# Patient Record
Sex: Male | Born: 1970 | Race: White | Hispanic: No | Marital: Married | Smoking: Current every day smoker
Health system: Southern US, Community
[De-identification: ages and names within clinical notes are randomized; demographics above are authoritative.]

## PROBLEM LIST (undated history)

## (undated) DIAGNOSIS — J811 Chronic pulmonary edema: Secondary | ICD-10-CM

## (undated) DIAGNOSIS — E119 Type 2 diabetes mellitus without complications: Secondary | ICD-10-CM

## (undated) DIAGNOSIS — I10 Essential (primary) hypertension: Secondary | ICD-10-CM

## (undated) DIAGNOSIS — F419 Anxiety disorder, unspecified: Secondary | ICD-10-CM

## (undated) DIAGNOSIS — E1159 Type 2 diabetes mellitus with other circulatory complications: Secondary | ICD-10-CM

## (undated) HISTORY — DX: Type 2 diabetes mellitus with other circulatory complications: E11.59

## (undated) HISTORY — PX: NO PAST SURGERIES: SHX2092

## (undated) HISTORY — DX: Essential (primary) hypertension: I10

---

## 2002-10-11 ENCOUNTER — Emergency Department (HOSPITAL_COMMUNITY): Admission: EM | Admit: 2002-10-11 | Discharge: 2002-10-11 | Payer: Self-pay | Admitting: *Deleted

## 2003-08-10 ENCOUNTER — Emergency Department (HOSPITAL_COMMUNITY): Admission: EM | Admit: 2003-08-10 | Discharge: 2003-08-10 | Payer: Self-pay | Admitting: Emergency Medicine

## 2004-03-08 ENCOUNTER — Emergency Department (HOSPITAL_COMMUNITY): Admission: EM | Admit: 2004-03-08 | Discharge: 2004-03-08 | Payer: Self-pay | Admitting: Emergency Medicine

## 2004-03-10 ENCOUNTER — Emergency Department (HOSPITAL_COMMUNITY): Admission: EM | Admit: 2004-03-10 | Discharge: 2004-03-10 | Payer: Self-pay | Admitting: Emergency Medicine

## 2005-03-27 ENCOUNTER — Ambulatory Visit: Payer: Self-pay | Admitting: Family Medicine

## 2005-04-07 ENCOUNTER — Ambulatory Visit: Payer: Self-pay | Admitting: Family Medicine

## 2005-05-11 ENCOUNTER — Ambulatory Visit: Payer: Self-pay | Admitting: Family Medicine

## 2005-05-13 ENCOUNTER — Ambulatory Visit (HOSPITAL_COMMUNITY): Admission: RE | Admit: 2005-05-13 | Discharge: 2005-05-13 | Payer: Self-pay | Admitting: Family Medicine

## 2005-05-13 ENCOUNTER — Encounter (HOSPITAL_COMMUNITY): Admission: RE | Admit: 2005-05-13 | Discharge: 2005-06-12 | Payer: Self-pay | Admitting: Family Medicine

## 2006-02-03 ENCOUNTER — Emergency Department (HOSPITAL_COMMUNITY): Admission: EM | Admit: 2006-02-03 | Discharge: 2006-02-03 | Payer: Self-pay | Admitting: Emergency Medicine

## 2007-06-16 ENCOUNTER — Encounter: Payer: Self-pay | Admitting: Family Medicine

## 2009-09-02 ENCOUNTER — Emergency Department (HOSPITAL_COMMUNITY): Admission: EM | Admit: 2009-09-02 | Discharge: 2009-09-02 | Payer: Self-pay | Admitting: Emergency Medicine

## 2009-12-24 ENCOUNTER — Emergency Department (HOSPITAL_COMMUNITY): Admission: EM | Admit: 2009-12-24 | Discharge: 2009-12-24 | Payer: Self-pay | Admitting: Emergency Medicine

## 2010-07-15 NOTE — Letter (Signed)
Summary: rpc chart  rpc chart   Imported By: Curtis Sites 03/17/2010 09:56:40  _____________________________________________________________________  External Attachment:    Type:   Image     Comment:   External Document

## 2014-08-01 DIAGNOSIS — E11649 Type 2 diabetes mellitus with hypoglycemia without coma: Secondary | ICD-10-CM | POA: Insufficient documentation

## 2017-04-03 ENCOUNTER — Emergency Department (HOSPITAL_BASED_OUTPATIENT_CLINIC_OR_DEPARTMENT_OTHER)
Admission: EM | Admit: 2017-04-03 | Discharge: 2017-04-03 | Disposition: A | Discharge status: Home / Self Care | Payer: No Typology Code available for payment source | Attending: Emergency Medicine | Admitting: Emergency Medicine

## 2017-04-03 ENCOUNTER — Encounter (HOSPITAL_BASED_OUTPATIENT_CLINIC_OR_DEPARTMENT_OTHER): Payer: Self-pay | Admitting: *Deleted

## 2017-04-03 ENCOUNTER — Emergency Department (HOSPITAL_BASED_OUTPATIENT_CLINIC_OR_DEPARTMENT_OTHER): Payer: No Typology Code available for payment source

## 2017-04-03 DIAGNOSIS — Z79899 Other long term (current) drug therapy: Secondary | ICD-10-CM | POA: Insufficient documentation

## 2017-04-03 DIAGNOSIS — R1012 Left upper quadrant pain: Secondary | ICD-10-CM

## 2017-04-03 DIAGNOSIS — E119 Type 2 diabetes mellitus without complications: Secondary | ICD-10-CM | POA: Insufficient documentation

## 2017-04-03 DIAGNOSIS — F17210 Nicotine dependence, cigarettes, uncomplicated: Secondary | ICD-10-CM | POA: Diagnosis not present

## 2017-04-03 HISTORY — DX: Chronic pulmonary edema: J81.1

## 2017-04-03 HISTORY — DX: Anxiety disorder, unspecified: F41.9

## 2017-04-03 HISTORY — DX: Type 2 diabetes mellitus without complications: E11.9

## 2017-04-03 LAB — CBC WITH DIFFERENTIAL/PLATELET
BASOS PCT: 0 %
Basophils Absolute: 0 10*3/uL (ref 0.0–0.1)
Eosinophils Absolute: 0.2 10*3/uL (ref 0.0–0.7)
Eosinophils Relative: 2 %
HEMATOCRIT: 46.3 % (ref 39.0–52.0)
HEMOGLOBIN: 16 g/dL (ref 13.0–17.0)
Lymphocytes Relative: 28 %
Lymphs Abs: 2.9 10*3/uL (ref 0.7–4.0)
MCH: 32.1 pg (ref 26.0–34.0)
MCHC: 34.6 g/dL (ref 30.0–36.0)
MCV: 92.8 fL (ref 78.0–100.0)
Monocytes Absolute: 0.7 10*3/uL (ref 0.1–1.0)
Monocytes Relative: 7 %
Neutro Abs: 6.6 10*3/uL (ref 1.7–7.7)
Neutrophils Relative %: 63 %
Platelets: 288 10*3/uL (ref 150–400)
RBC: 4.99 MIL/uL (ref 4.22–5.81)
RDW: 13 % (ref 11.5–15.5)
WBC: 10.3 10*3/uL (ref 4.0–10.5)

## 2017-04-03 LAB — URINALYSIS, ROUTINE W REFLEX MICROSCOPIC
BILIRUBIN URINE: NEGATIVE
GLUCOSE, UA: 100 mg/dL — AB
Hgb urine dipstick: NEGATIVE
KETONES UR: NEGATIVE mg/dL
LEUKOCYTES UA: NEGATIVE
Nitrite: NEGATIVE
Protein, ur: NEGATIVE mg/dL
Specific Gravity, Urine: 1.02 (ref 1.005–1.030)
pH: 6 (ref 5.0–8.0)

## 2017-04-03 LAB — COMPREHENSIVE METABOLIC PANEL
ALBUMIN: 4.2 g/dL (ref 3.5–5.0)
ALK PHOS: 79 U/L (ref 38–126)
ALT: 24 U/L (ref 17–63)
AST: 22 U/L (ref 15–41)
Anion gap: 5 (ref 5–15)
BUN: 27 mg/dL — AB (ref 6–20)
CALCIUM: 9.1 mg/dL (ref 8.9–10.3)
CO2: 23 mmol/L (ref 22–32)
Chloride: 107 mmol/L (ref 101–111)
Creatinine, Ser: 0.81 mg/dL (ref 0.61–1.24)
GFR calc Af Amer: >60 mL/min (ref >60)
GFR calc non Af Amer: >60 mL/min (ref >60)
Glucose, Bld: 126 mg/dL — ABNORMAL HIGH (ref 65–99)
Potassium: 4.3 mmol/L (ref 3.5–5.1)
Sodium: 135 mmol/L (ref 135–145)
Total Bilirubin: 0.3 mg/dL (ref 0.3–1.2)
Total Protein: 7.1 g/dL (ref 6.5–8.1)

## 2017-04-03 LAB — LIPASE, BLOOD: Lipase: 30 U/L (ref 11–51)

## 2017-04-03 LAB — MONONUCLEOSIS SCREEN: Mono Screen: NEGATIVE

## 2017-04-03 MED ORDER — SODIUM CHLORIDE 0.9 % IV BOLUS (SEPSIS)
1000.0000 mL | Freq: Once | INTRAVENOUS | Status: AC
  Administered 2017-04-03: 1000 mL via INTRAVENOUS

## 2017-04-03 MED ORDER — IOPAMIDOL (ISOVUE-300) INJECTION 61%
100.0000 mL | Freq: Once | INTRAVENOUS | Status: AC | PRN
  Administered 2017-04-03: 100 mL via INTRAVENOUS

## 2017-04-03 NOTE — ED Triage Notes (Signed)
Pt reports LUQ pain with tenderness for several months. Denies fever, n/v/d, sob, chest pain.

## 2017-04-03 NOTE — ED Notes (Signed)
CT will need to wait for bun/creat to result prior to imaging with IV contrast d/t pt hx DM, per radiology protocol

## 2017-04-03 NOTE — ED Notes (Signed)
ED Provider at bedside. 

## 2017-04-03 NOTE — ED Provider Notes (Signed)
[facility] EMERGENCY DEPARTMENT Provider Note   CSN: [id number] Arrival date & time: 04/03/17  0827     History   Chief Complaint Chief Complaint  Patient presents with  . Abdominal Pain    HPI Jose Barton is a 46 y.o. male.  Pt presents to the ED today with LUQ abdominal pain.  Sx have been going on for months, but it has gotten worse in the last week.  He has never seen a doctor for this pain.  He denies any other sx.      Past Medical History:  Diagnosis Date  . Anxiety   . Diabetes mellitus without complication (HCC)   . Pulmonary edema     There are no active problems to display for this patient.   History reviewed. No pertinent surgical history.     Home Medications    Prior to Admission medications   Medication Sig Start Date End Date Taking? Authorizing Provider  busPIRone (BUSPAR) 5 MG tablet Take 10 mg by mouth daily.   Yes [provider]  escitalopram (LEXAPRO) 5 MG tablet Take 5 mg by mouth daily.   Yes [provider]    Family History No family history on file.  Social History Social History  Substance Use Topics  . Smoking status: Current Every Day Smoker    Types: Cigarettes  . Smokeless tobacco: Never Used  . Alcohol use Yes     Comment: daily     Allergies   Codeine; Percocet [oxycodone-acetaminophen]; and Vicodin [hydrocodone-acetaminophen]   Review of Systems Review of Systems  Gastrointestinal: Positive for abdominal pain.  All other systems reviewed and are negative.    Physical Exam Updated Vital Signs BP 133/82   Pulse 73   Temp 98.5 F (36.9 C) (Oral)   Resp 20   Ht 5\' 8"  (1.727 m)   Wt 83.9 kg (185 lb)   SpO2 96%   BMI 28.13 kg/m   Physical Exam  Constitutional: He is oriented to person, place, and time. He appears well-developed and well-nourished.  HENT:  Head: Normocephalic and atraumatic.  Right Ear: External ear normal.  Left Ear: External ear normal.  Nose:  Nose normal.  Mouth/Throat: Oropharynx is clear and moist.  Eyes: Pupils are equal, round, and reactive to light. Conjunctivae and EOM are normal.  Neck: Normal range of motion. Neck supple.  Cardiovascular: Normal rate, regular rhythm, normal heart sounds and intact distal pulses.   Pulmonary/Chest: Effort normal and breath sounds normal.  Abdominal: Soft. Bowel sounds are normal. There is tenderness in the left upper quadrant.  Musculoskeletal: Normal range of motion.  Neurological: He is alert and oriented to person, place, and time.  Skin: Skin is warm. Capillary refill takes less than 2 seconds.  Psychiatric: He has a normal mood and affect. His behavior is normal. Judgment and thought content normal.  Nursing note and vitals reviewed.    ED Treatments / Results  Labs (all labs ordered are listed, but only abnormal results are displayed) Labs Reviewed  COMPREHENSIVE METABOLIC PANEL - Abnormal; Notable for the following:       Result Value   Glucose, Bld 126 (*)    BUN 27 (*)    All other components within normal limits  URINALYSIS, ROUTINE W REFLEX MICROSCOPIC - Abnormal; Notable for the following:    Glucose, UA 100 (*)    All other components within normal limits  CBC WITH DIFFERENTIAL/PLATELET  LIPASE, BLOOD  MONONUCLEOSIS SCREEN  EKG  EKG Interpretation None       Radiology Ct Abdomen Pelvis W Contrast  Result Date: 04/03/2017 CLINICAL DATA:  Left-sided abdominal pain for several months EXAM: CT ABDOMEN AND PELVIS WITH CONTRAST TECHNIQUE: Multidetector CT imaging of the abdomen and pelvis was performed using the standard protocol following bolus administration of intravenous contrast. CONTRAST:  ISOVUE-300 IOPAMIDOL (ISOVUE-300) INJECTION 61% COMPARISON:  None. FINDINGS: Lower chest: No acute abnormality. Hepatobiliary: Fatty infiltration of the liver is noted. The gallbladder is within normal limits. Pancreas: Unremarkable. No pancreatic ductal dilatation  or surrounding inflammatory changes. Spleen: Normal in size without focal abnormality. Adrenals/Urinary Tract: The adrenal glands are within normal limits. The kidneys are well visualized bilaterally within normal enhancement pattern. Normal excretion is noted. The bladder is decompressed. Stomach/Bowel: Stomach is within normal limits. Appendix appears normal. No evidence of bowel wall thickening, distention, or inflammatory changes. Vascular/Lymphatic: Aortic atherosclerosis. No enlarged abdominal or pelvic lymph nodes. Reproductive: Prostate is unremarkable. Other: No abdominal wall hernia or abnormality. No abdominopelvic ascites. Musculoskeletal: No acute or significant osseous findings. IMPRESSION: Chronic changes without acute abnormality. Electronically Signed   By: Alcide Clever M.D.   On: 04/03/2017 10:05    Procedures Procedures (including critical care time)  Medications Ordered in ED Medications  sodium chloride 0.9 % bolus 1,000 mL (0 mLs Intravenous Stopped 04/03/17 0942)  iopamidol (ISOVUE-300) 61 % injection 100 mL (100 mLs Intravenous Contrast Given 04/03/17 0942)     Initial Impression / Assessment and Plan / ED Course  I have reviewed the triage vital signs and the nursing notes.  Pertinent labs & imaging results that were available during my care of the patient were reviewed by me and considered in my medical decision making (see chart for details).     Pt looks good.  He did not want any pain meds here or for home.  He is given the number of GI to f/u.  He knows to return if worse.  Final Clinical Impressions(s) / ED Diagnoses   Final diagnoses:  Left upper quadrant pain    New Prescriptions New Prescriptions   No medications on file     Jacalyn Lefevre, MD 04/03/17 1017

## 2017-05-24 ENCOUNTER — Other Ambulatory Visit: Payer: Self-pay

## 2017-05-24 ENCOUNTER — Encounter (HOSPITAL_BASED_OUTPATIENT_CLINIC_OR_DEPARTMENT_OTHER): Payer: Self-pay | Admitting: Emergency Medicine

## 2017-05-24 ENCOUNTER — Emergency Department (HOSPITAL_BASED_OUTPATIENT_CLINIC_OR_DEPARTMENT_OTHER)
Admission: EM | Admit: 2017-05-24 | Discharge: 2017-05-24 | Disposition: A | Discharge status: Home / Self Care | Payer: No Typology Code available for payment source | Attending: Emergency Medicine | Admitting: Emergency Medicine

## 2017-05-24 DIAGNOSIS — S39012A Strain of muscle, fascia and tendon of lower back, initial encounter: Secondary | ICD-10-CM

## 2017-05-24 DIAGNOSIS — Y990 Civilian activity done for income or pay: Secondary | ICD-10-CM | POA: Insufficient documentation

## 2017-05-24 DIAGNOSIS — X501XXA Overexertion from prolonged static or awkward postures, initial encounter: Secondary | ICD-10-CM | POA: Insufficient documentation

## 2017-05-24 DIAGNOSIS — Y929 Unspecified place or not applicable: Secondary | ICD-10-CM | POA: Insufficient documentation

## 2017-05-24 DIAGNOSIS — S3992XA Unspecified injury of lower back, initial encounter: Secondary | ICD-10-CM | POA: Diagnosis present

## 2017-05-24 DIAGNOSIS — Y939 Activity, unspecified: Secondary | ICD-10-CM | POA: Diagnosis not present

## 2017-05-24 DIAGNOSIS — E119 Type 2 diabetes mellitus without complications: Secondary | ICD-10-CM | POA: Insufficient documentation

## 2017-05-24 DIAGNOSIS — F17210 Nicotine dependence, cigarettes, uncomplicated: Secondary | ICD-10-CM | POA: Insufficient documentation

## 2017-05-24 MED ORDER — CYCLOBENZAPRINE HCL 10 MG PO TABS
10.0000 mg | ORAL_TABLET | Freq: Once | ORAL | Status: AC
  Administered 2017-05-24: 10 mg via ORAL
  Filled 2017-05-24: qty 1

## 2017-05-24 MED ORDER — CYCLOBENZAPRINE HCL 10 MG PO TABS: 10.0000 mg | ORAL_TABLET | Freq: Three times a day (TID) | ORAL | 0 refills | Status: DC | PRN

## 2017-05-24 MED ORDER — IBUPROFEN 800 MG PO TABS: 800.0000 mg | ORAL_TABLET | Freq: Three times a day (TID) | ORAL | 0 refills | Status: DC | PRN

## 2017-05-24 MED ORDER — IBUPROFEN 800 MG PO TABS
800.0000 mg | ORAL_TABLET | Freq: Once | ORAL | Status: AC
  Administered 2017-05-24: 800 mg via ORAL
  Filled 2017-05-24: qty 1

## 2017-05-24 MED FILL — CYCLOBENZAPRINE 10 MG TABLE: 10 | 5 days supply | Qty: 15 | Fill #0

## 2017-05-24 MED FILL — IBUPROFEN 800 MG TAB: 800 | 10 days supply | Qty: 30 | Fill #0

## 2017-05-24 NOTE — ED Provider Notes (Signed)
TIME SEEN: 1:01 PM  CHIEF COMPLAINT: Back pain  HPI: Patient is a 46 year old male with history of type 2 diabetes who presents to the emergency department with diffuse lower back pain that started yesterday.  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  Denies any other injury.  States it is worse with walking and trying to straighten out fully.  Reports he has been alternating heat and ice and had his wife massage his back which has been helping him.  No fevers.  No history of back surgery or epidural injections.  No current numbness, tingling or focal weakness.  No bowel or bladder incontinence.  No urinary retention.  ROS: See HPI Constitutional: no fever  Eyes: no drainage  ENT: no runny nose   Cardiovascular:  no chest pain  Resp: no SOB  GI: no vomiting GU: no dysuria Integumentary: no rash  Allergy: no hives  Musculoskeletal: no leg swelling  Neurological: no slurred speech ROS otherwise negative  PAST MEDICAL HISTORY/PAST SURGICAL HISTORY:  Past Medical History:  Diagnosis Date  . Anxiety   . Diabetes mellitus without complication (HCC)   . Pulmonary edema     MEDICATIONS:  Prior to Admission medications   Medication Sig Start Date End Date Taking? Authorizing Provider  busPIRone (BUSPAR) 5 MG tablet Take 10 mg by mouth daily.    [provider]  escitalopram (LEXAPRO) 5 MG tablet Take 5 mg by mouth daily.    [provider]    ALLERGIES:  Allergies  Allergen Reactions  . Codeine Hives  . Percocet [Oxycodone-Acetaminophen] Hives  . Vicodin [Hydrocodone-Acetaminophen] Hives    SOCIAL HISTORY:  Social History   Tobacco Use  . Smoking status: Current Every Day Smoker    Types: Cigarettes  . Smokeless tobacco: Never Used  Substance Use Topics  . Alcohol use: Yes    Comment: daily    FAMILY HISTORY: History reviewed. No pertinent family history.  EXAM: BP (!) 138/93  (BP Location: Left Arm)   Pulse 87   Temp 98.3 F (36.8 C) (Oral)   Resp 18   Ht 5\' 8"  (1.727 m)   Wt 83.9 kg (185 lb)   SpO2 100%   BMI 28.13 kg/m  CONSTITUTIONAL: Alert and oriented and responds appropriately to questions. Well-appearing; well-nourished HEAD: Normocephalic EYES: Conjunctivae clear, pupils appear equal, EOMI ENT: normal nose; moist mucous membranes NECK: Supple, no meningismus, no nuchal rigidity, no LAD  CARD: RRR; S1 and S2 appreciated; no murmurs, no clicks, no rubs, no gallops RESP: Normal chest excursion without splinting or tachypnea; breath sounds clear and equal bilaterally; no wheezes, no rhonchi, no rales, no hypoxia or respiratory distress, speaking full sentences ABD/GI: Normal bowel sounds; non-distended; soft, non-tender, no rebound, no guarding, no peritoneal signs, no hepatosplenomegaly BACK:  The back appears normal and is tender to palpation over the right and left lumbar paraspinal muscles.  There is no midline spinal tenderness or step-off or deformity.  There is no redness, warmth, ecchymosis or swelling noted.  There is no CVA tenderness EXT: Normal ROM in all joints; non-tender to palpation; no edema; normal capillary refill; no cyanosis, no calf tenderness or swelling    SKIN: Normal color for age and race; warm; no rash NEURO: Moves all extremities equally, strength 5/5 in all 4 extremities, sensation to light touch intact diffusely, no saddle anesthesia, walks with a slow gait, 2+ deep tendon  reflexes in bilateral lower extremities PSYCH: The patient's mood and manner are appropriate. Grooming and personal hygiene are appropriate.  MEDICAL DECISION MAKING: Pt here with lumbar strain.  He has no red flag symptoms.  Doubt cauda equina, spinal stenosis, epidural abscess or hematoma, discitis or osteomyelitis, transverse myelitis.  Doubt fracture.  I do not feel he needs emergent imaging and he agrees.  States he has strong reaction to narcotics.  Will  discharge with Flexeril and ibuprofen.  Recommend alternating heat and ice.  Recommended stretching and continued movement.  Provided him with a work note.  Discussed return precautions.  Patient comfortable with this plan.  His wife is here and will drive him home.  At this time, I do not feel there is any life-threatening condition present. I have reviewed and discussed all results (EKG, imaging, lab, urine as appropriate) and exam findings with patient/family. I have reviewed nursing notes and appropriate previous records.  I feel the patient is safe to be discharged home without further emergent workup and can continue workup as an outpatient as needed. Discussed usual and customary return precautions. Patient/family verbalize understanding and are comfortable with this plan.  Outpatient follow-up has been provided if needed. All questions have been answered.      Kiyra Slaubaugh, Layla MawKristen N, DO 05/24/17 1311

## 2017-05-24 NOTE — ED Triage Notes (Signed)
Patient states that he started to have pain to his lower back starting on Friday - the patient reports that he turned the wrong way at work

## 2017-07-01 ENCOUNTER — Ambulatory Visit: Payer: Self-pay | Admitting: Physician Assistant

## 2017-07-13 ENCOUNTER — Ambulatory Visit: Payer: No Typology Code available for payment source | Admitting: Physician Assistant

## 2017-07-13 ENCOUNTER — Encounter: Payer: Self-pay | Admitting: Physician Assistant

## 2017-07-13 VITALS — BP 150/90 | HR 84 | Temp 98.4°F | Resp 16 | Ht 68.0 in | Wt 189.0 lb

## 2017-07-13 DIAGNOSIS — Z1329 Encounter for screening for other suspected endocrine disorder: Secondary | ICD-10-CM | POA: Diagnosis not present

## 2017-07-13 DIAGNOSIS — F17200 Nicotine dependence, unspecified, uncomplicated: Secondary | ICD-10-CM

## 2017-07-13 DIAGNOSIS — E119 Type 2 diabetes mellitus without complications: Secondary | ICD-10-CM

## 2017-07-13 DIAGNOSIS — E785 Hyperlipidemia, unspecified: Secondary | ICD-10-CM | POA: Diagnosis not present

## 2017-07-13 DIAGNOSIS — F419 Anxiety disorder, unspecified: Secondary | ICD-10-CM | POA: Diagnosis not present

## 2017-07-13 DIAGNOSIS — Z789 Other specified health status: Secondary | ICD-10-CM | POA: Diagnosis not present

## 2017-07-13 DIAGNOSIS — E1169 Type 2 diabetes mellitus with other specified complication: Secondary | ICD-10-CM

## 2017-07-13 DIAGNOSIS — Z130 Encounter for screening for diseases of the blood and blood-forming organs and certain disorders involving the immune mechanism: Secondary | ICD-10-CM

## 2017-07-13 DIAGNOSIS — F1010 Alcohol abuse, uncomplicated: Secondary | ICD-10-CM | POA: Insufficient documentation

## 2017-07-13 DIAGNOSIS — F1090 Alcohol use, unspecified, uncomplicated: Secondary | ICD-10-CM

## 2017-07-13 DIAGNOSIS — Z7289 Other problems related to lifestyle: Secondary | ICD-10-CM

## 2017-07-13 DIAGNOSIS — Z720 Tobacco use: Secondary | ICD-10-CM

## 2017-07-13 LAB — POCT GLYCOSYLATED HEMOGLOBIN (HGB A1C): Hemoglobin A1C: 6.1

## 2017-07-13 NOTE — Patient Instructions (Signed)

## 2017-07-13 NOTE — Progress Notes (Signed)
Patient: Jose Barton Male    DOB: [birth year]   47 y.o.   MRN: [id number] Visit Date: 07/13/2017  Today's Provider: Trey Sailors, PA-C   Chief Complaint  Patient presents with  . Establish Care  . Diabetes  . Depression   Subjective:    Jose Barton moved out here form [place], [place] after marrying his wife two years ago. He was previously seen by a provider in [place]. He has childrentwo grown and two twin 47 year old boys 33.   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 Tetanus shot: within the last ten years  Flu shot: declined Pneumonia vaccine: unknown, will get records  Currently smokes 1/2 pack per day and drinks 3-4 beers nightly. Does not use drugs.   He reports he has had diabetes since age 48. He reports he is not currently taking any medications for it. Previously was on metformin. He has not had an eye exam in 5 years. He denies any previous diagnosis of peripheral neuropathy. He reports he has never been on blood pressure medication. He was previously on Simvastatin but self discontinued.  He also has anxiety and depression, takes Lexapro 20 mg and Buspar 5 mg BID.   Diabetes  He presents for his follow-up diabetic visit. He has type 2 diabetes mellitus. His disease course has been stable. There are no hypoglycemic associated symptoms. Pertinent negatives for hypoglycemia include no headaches. Pertinent negatives for diabetes include no blurred vision, no chest pain, no fatigue, no foot paresthesias, no foot ulcerations, no polydipsia, no polyphagia, no polyuria, no visual change, no weakness and no weight loss. Symptoms are stable. His weight is stable. He is following a diabetic diet. He has not had a previous visit with a dietitian. Home blood sugar record trend: Pt does not check his blood sugar at home.  An ACE inhibitor/angiotensin II receptor blocker is not being taken. He does not see a  podiatrist.Eye exam is not current.  Depression         This is a chronic problem.The problem is unchanged (Pt reports doing well on current medication, just having a hard time sleeping).  Associated symptoms include insomnia.  Associated symptoms include no decreased concentration, no fatigue, no helplessness, no hopelessness, not irritable, no restlessness, no decreased interest, no appetite change, no body aches, no myalgias, no headaches, no indigestion, not sad and no suicidal ideas.  Past treatments include SSRIs - Selective serotonin reuptake inhibitors.  Compliance with treatment is good.  Previous treatment provided moderate relief.      Allergies  Allergen Reactions  . Codeine Hives  . Percocet [Oxycodone-Acetaminophen] Hives  . Vicodin [Hydrocodone-Acetaminophen] Hives     Current Outpatient Medications:  .  busPIRone (BUSPAR) 5 MG tablet, Take 5 mg by mouth 2 (two) times daily., Disp: , Rfl:  .  escitalopram (LEXAPRO) 20 MG tablet, Take 20 mg by mouth daily., Disp: , Rfl:  .  ibuprofen (ADVIL,MOTRIN) 800 MG tablet, Take 1 tablet (800 mg total) by mouth every 8 (eight) hours as needed for mild pain., Disp: 30 tablet, Rfl: 0  Review of Systems  Constitutional: Negative for appetite change, fatigue and weight loss.  Eyes: Negative for blurred vision.  Cardiovascular: Negative for chest pain.  Endocrine: Negative for polydipsia, polyphagia and polyuria.  Musculoskeletal: Negative for myalgias.  Neurological: Negative for weakness and headaches.  Psychiatric/Behavioral: Positive for depression. Negative for decreased concentration  and suicidal ideas. The patient has insomnia.     Social History   Tobacco Use  . Smoking status: Current Every Day Smoker    Packs/day: 0.50    Years: 30.00    Pack years: 15.00    Types: Cigarettes  . Smokeless tobacco: Never Used  Substance Use Topics  . Alcohol use: Yes    Alcohol/week: 14.4 oz    Types: 24 Cans of beer per week     Comment: daily   Objective:   BP (!) 144/94 (BP Location: Right Arm, Patient Position: Sitting, Cuff Size: Normal)   Pulse 84   Temp 98.4 F (36.9 C) (Oral)   Resp 16   Ht 5\' 8"  (1.727 m)   Wt 189 lb (85.7 kg)   BMI 28.74 kg/m  Vitals:   07/13/17 0919  BP: (!) 144/94  Pulse: 84  Resp: 16  Temp: 98.4 F (36.9 C)  TempSrc: Oral  Weight: 189 lb (85.7 kg)  Height: 5\' 8"  (1.727 m)   Depression screen [facility] 2/9 07/13/2017  Decreased Interest 0  Down, Depressed, Hopeless 0  PHQ - 2 Score 0  Altered sleeping 2  Tired, decreased energy 0  Change in appetite 0  Feeling bad or failure about yourself  0  Trouble concentrating 0  Moving slowly or fidgety/restless 0  Suicidal thoughts 0  PHQ-9 Score 2  Difficult doing work/chores Not difficult at all      Physical Exam  Constitutional: He appears well-developed and well-nourished. He is not irritable.  Cardiovascular: Normal rate and regular rhythm.  Pulmonary/Chest: Effort normal and breath sounds normal.  Abdominal: Soft. Bowel sounds are normal.  Skin: Skin is warm and dry.  Psychiatric: He has a normal mood and affect. His behavior is normal.   Diabetic Foot Exam - Simple   Simple Foot Form Diabetic Foot exam was performed with the following findings:  Yes 07/13/2017 12:00 PM  Visual Inspection No deformities, no ulcerations, no other skin breakdown bilaterally:  Yes Sensation Testing Intact to touch and monofilament testing bilaterally:  Yes Pulse Check Posterior Tibialis and Dorsalis pulse intact bilaterally:  Yes Comments         Assessment & Plan:     1. Controlled type 2 diabetes mellitus without complication, without long-term current use of insulin (HCC)  A1C today is 6.1%, diet controlled. Will check urine microalbumin as below. His BP is high today, need second reading to classify HTN. Will need ACEi if persistent microalbuminuria or HTN. Also will need statin if LDL >100 or CVD risk >7.5%, which with his  smoking, diabetes, and BP I imagine it will be. He has simvastatin at home that he is willing to restart if required. Will need to get records, if no record, then will administer pneumonia vaccine at next visit.  - Urine Microalbumin w/creat. ratio - Comprehensive Metabolic Panel (CMET) - Lipid Profile - Ambulatory referral to Ophthalmology - POCT HgB A1C  2. Hyperlipidemia associated with type 2 diabetes mellitus (HCC)  - Lipid Profile  3. Screening for thyroid disorder  - TSH  4. Screening for deficiency anemia  - CBC with Differential  5. Anxiety  Will refill buspar and lexapro.  6. Alcohol use  2 drinks is max daily allowance in adult male, his usage is over the limit and he has been made aware of this.  7. Tobacco abuse  Says he is allergic to Chantix and Wellbutrin. Does not want nicotine patch. Counseled on smoking cessation very important  to reduce HTN and risk of heart disease. 3 minutes smoking cessation counseling provided.  Return in about 4 weeks (around 08/10/2017) for dm, bp.  The entirety of the information documented in the History of Present Illness, Review of Systems and Physical Exam were personally obtained by me. Portions of this information were initially documented by April M. Hyacinth Meeker, CMA and reviewed by me for thoroughness and accuracy.          Trey Sailors, PA-C  [facility]

## 2017-07-14 LAB — CBC WITH DIFFERENTIAL/PLATELET
Basophils Absolute: 0 10*3/uL (ref 0.0–0.2)
Basos: 0 %
EOS (ABSOLUTE): 0.2 10*3/uL (ref 0.0–0.4)
Eos: 2 %
Hematocrit: 47.4 % (ref 37.5–51.0)
Hemoglobin: 16.5 g/dL (ref 13.0–17.7)
Immature Grans (Abs): 0.1 10*3/uL (ref 0.0–0.1)
Immature Granulocytes: 1 %
Lymphocytes Absolute: 3.2 10*3/uL — ABNORMAL HIGH (ref 0.7–3.1)
Lymphs: 24 %
MCH: 32.5 pg (ref 26.6–33.0)
MCHC: 34.8 g/dL (ref 31.5–35.7)
MCV: 93 fL (ref 79–97)
Monocytes Absolute: 0.9 10*3/uL (ref 0.1–0.9)
Monocytes: 7 %
Neutrophils Absolute: 9 10*3/uL — ABNORMAL HIGH (ref 1.4–7.0)
Neutrophils: 66 %
Platelets: 310 10*3/uL (ref 150–379)
RBC: 5.08 x10E6/uL (ref 4.14–5.80)
RDW: 13.4 % (ref 12.3–15.4)
WBC: 13.5 10*3/uL — ABNORMAL HIGH (ref 3.4–10.8)

## 2017-07-14 LAB — COMPREHENSIVE METABOLIC PANEL
ALT: 23 IU/L (ref 0–44)
AST: 19 IU/L (ref 0–40)
Albumin/Globulin Ratio: 2.2 (ref 1.2–2.2)
Albumin: 4.8 g/dL (ref 3.5–5.5)
Alkaline Phosphatase: 93 IU/L (ref 39–117)
BUN/Creatinine Ratio: 17 (ref 9–20)
BUN: 14 mg/dL (ref 6–24)
Bilirubin Total: 0.5 mg/dL (ref 0.0–1.2)
CO2: 21 mmol/L (ref 20–29)
Calcium: 9.4 mg/dL (ref 8.7–10.2)
Chloride: 102 mmol/L (ref 96–106)
Creatinine, Ser: 0.82 mg/dL (ref 0.76–1.27)
GFR calc Af Amer: 123 mL/min/{1.73_m2} (ref >59)
GFR calc non Af Amer: 106 mL/min/{1.73_m2} (ref >59)
Globulin, Total: 2.2 g/dL (ref 1.5–4.5)
Glucose: 92 mg/dL (ref 65–99)
Potassium: 4.3 mmol/L (ref 3.5–5.2)
Sodium: 142 mmol/L (ref 134–144)
Total Protein: 7 g/dL (ref 6.0–8.5)

## 2017-07-14 LAB — MICROALBUMIN / CREATININE URINE RATIO
Creatinine, Urine: 132.8 mg/dL
Microalb/Creat Ratio: 21.9 mg/g creat (ref 0.0–30.0)
Microalbumin, Urine: 29.1 ug/mL

## 2017-07-14 LAB — LIPID PANEL
Chol/HDL Ratio: 5.4 ratio — ABNORMAL HIGH (ref 0.0–5.0)
Cholesterol, Total: 233 mg/dL — ABNORMAL HIGH (ref 100–199)
HDL: 43 mg/dL (ref >39)
LDL Calculated: 154 mg/dL — ABNORMAL HIGH (ref 0–99)
Triglycerides: 178 mg/dL — ABNORMAL HIGH (ref 0–149)
VLDL Cholesterol Cal: 36 mg/dL (ref 5–40)

## 2017-07-14 LAB — TSH: TSH: 1.74 u[IU]/mL (ref 0.450–4.500)

## 2017-07-16 ENCOUNTER — Telehealth: Payer: Self-pay

## 2017-07-16 NOTE — Telephone Encounter (Signed)
-----   Message from Trey SailorsAdriana M Pollak, [place]PA-C sent at 07/15/2017 10:57 AM EST ----- Protein in urine borderline for needing BP medication, will recheck at follow up visit. CMET normal. Lipid panel is elevated and LDL is not at goal for somebody with DM. He reports he has leftover statin therapy. Please remind me of which one it is and the dose? If it has not expired and it is high intensity, he can resume taking this once nightly. TSH normal. CBC with slightly elevated white count, may be fighting small infection. Can check again at f/u if pt wants.

## 2017-07-16 NOTE — Telephone Encounter (Signed)
Pt advised.  He will restart his cholesterol medication.  He states it is in date.  He is taking Simvastatin but not sure the dose.  He will call back with the information.   Thanks,   -Vernona RiegerLaura

## 2017-07-16 NOTE — Telephone Encounter (Signed)
LMTCB 07/16/2017  Thanks,   -Laura 

## 2017-08-06 ENCOUNTER — Telehealth: Payer: Self-pay | Admitting: Physician Assistant

## 2017-08-09 LAB — HM DIABETES EYE EXAM

## 2017-08-10 ENCOUNTER — Encounter: Payer: Self-pay | Admitting: Physician Assistant

## 2017-08-13 ENCOUNTER — Ambulatory Visit: Payer: No Typology Code available for payment source | Admitting: Physician Assistant

## 2017-08-13 ENCOUNTER — Encounter: Payer: Self-pay | Admitting: Physician Assistant

## 2017-08-13 VITALS — BP 148/96 | HR 80 | Temp 98.9°F | Resp 16 | Wt 193.0 lb

## 2017-08-13 DIAGNOSIS — I152 Hypertension secondary to endocrine disorders: Secondary | ICD-10-CM

## 2017-08-13 DIAGNOSIS — E785 Hyperlipidemia, unspecified: Secondary | ICD-10-CM

## 2017-08-13 DIAGNOSIS — I10 Essential (primary) hypertension: Secondary | ICD-10-CM

## 2017-08-13 DIAGNOSIS — Z23 Encounter for immunization: Secondary | ICD-10-CM

## 2017-08-13 DIAGNOSIS — E1169 Type 2 diabetes mellitus with other specified complication: Secondary | ICD-10-CM

## 2017-08-13 DIAGNOSIS — E1159 Type 2 diabetes mellitus with other circulatory complications: Secondary | ICD-10-CM | POA: Diagnosis not present

## 2017-08-13 DIAGNOSIS — Z720 Tobacco use: Secondary | ICD-10-CM | POA: Diagnosis not present

## 2017-08-13 HISTORY — DX: Type 2 diabetes mellitus with other circulatory complications: E11.59

## 2017-08-13 MED ORDER — ATORVASTATIN CALCIUM 10 MG PO TABS
10.0000 mg | ORAL_TABLET | Freq: Every day | ORAL | 1 refills | Status: DC
Start: 2017-08-13 — End: 2017-09-28

## 2017-08-13 MED ORDER — LISINOPRIL 10 MG PO TABS: 10.0000 mg | ORAL_TABLET | Freq: Every day | ORAL | 3 refills | Status: DC

## 2017-08-13 NOTE — Progress Notes (Signed)
Patient: Jose PainRussell O Jares Male    DOB: 1970/07/12   47 y.o.   MRN: [id number] Visit Date: 08/13/2017  Today's Provider: Trey SailorsAdriana M Pollak, PA-C   No chief complaint on file.  Subjective:    Jose Barton is a 47 y/o man presenting for follow up of DM II, HLD, HTN. Last visit, his BP was elevated and urine microalbumine was borderline at 29. His cholesterol was also elevated. He restarted simvastatin 20 mg from previous prescription and is tolerating this well. Takes it nightly and is compliant. His BP remains elevated today and he will need a BP medication as well as a pneumonia 23 vaccination today.   Diabetes  He presents for his follow-up diabetic visit. He has type 2 diabetes mellitus. His disease course has been stable. Pertinent negatives for hypoglycemia include no dizziness, headaches or sweats. (Only in the mornings. ) There are no diabetic associated symptoms. Pertinent negatives for diabetes include no blurred vision and no chest pain. He is compliant with treatment all of the time. He is following a generally healthy diet. He participates in exercise daily. There is no change in his home blood glucose trend. An ACE inhibitor/angiotensin II receptor blocker is not being taken. He does not see a podiatrist.Eye exam is current.  Hyperlipidemia  This is a chronic problem. The problem is uncontrolled. Recent lipid tests were reviewed and are high. Pertinent negatives include no chest pain or shortness of breath. Current antihyperlipidemic treatment includes statins. There are no compliance problems.  Risk factors for coronary artery disease include diabetes mellitus and dyslipidemia.  Hypertension  This is a new problem. The problem is uncontrolled. Pertinent negatives include no anxiety, blurred vision, chest pain, headaches, malaise/fatigue, neck pain, orthopnea, palpitations, peripheral edema, PND, shortness of breath or sweats. There are no associated agents to hypertension. Risk  factors for coronary artery disease include diabetes mellitus and dyslipidemia.       Allergies  Allergen Reactions  . Codeine Hives  . Percocet [Oxycodone-Acetaminophen] Hives  . Vicodin [Hydrocodone-Acetaminophen] Hives     Current Outpatient Medications:  .  busPIRone (BUSPAR) 5 MG tablet, Take 5 mg by mouth 2 (two) times daily., Disp: , Rfl:  .  escitalopram (LEXAPRO) 20 MG tablet, Take 20 mg by mouth daily., Disp: , Rfl:  .  ibuprofen (ADVIL,MOTRIN) 800 MG tablet, Take 1 tablet (800 mg total) by mouth every 8 (eight) hours as needed for mild pain., Disp: 30 tablet, Rfl: 0 .  simvastatin (ZOCOR) 20 MG tablet, Take 20 mg by mouth daily., Disp: , Rfl:   Review of Systems  Constitutional: Negative.  Negative for malaise/fatigue.  Eyes: Negative for blurred vision.  Respiratory: Negative.  Negative for shortness of breath.   Cardiovascular: Negative.  Negative for chest pain, palpitations, orthopnea and PND.  Gastrointestinal: Negative.   Endocrine: Negative.   Musculoskeletal: Negative for neck pain.  Neurological: Negative for dizziness and headaches.    Social History   Tobacco Use  . Smoking status: Current Every Day Smoker    Packs/day: 0.50    Years: 30.00    Pack years: 15.00    Types: Cigarettes  . Smokeless tobacco: Never Used  Substance Use Topics  . Alcohol use: Yes    Alcohol/week: 14.4 oz    Types: 24 Cans of beer per week    Comment: daily   Objective:   BP (!) 148/96 (BP Location: Right Arm, Patient Position: Sitting, Cuff Size: Normal)  Pulse 80   Temp 98.9 F (37.2 C) (Oral)   Resp 16   Wt 193 lb (87.5 kg)   BMI 29.35 kg/m  Vitals:   08/13/17 0814  BP: (!) 148/96  Pulse: 80  Resp: 16  Temp: 98.9 F (37.2 C)  TempSrc: Oral  Weight: 193 lb (87.5 kg)     Physical Exam  Constitutional: He is oriented to person, place, and time. He appears well-developed and well-nourished.  Cardiovascular: Normal rate and regular rhythm.    Pulmonary/Chest: Effort normal and breath sounds normal.  Neurological: He is alert and oriented to person, place, and time.  Skin: Skin is warm and dry.  Psychiatric: He has a normal mood and affect. His behavior is normal.        Assessment & Plan:     1. Hyperlipidemia associated with type 2 diabetes mellitus (HCC)  Will switch him from simvastatin to atorvastatin. He may finish his rx for simvastatin and then start lipitor. Will get lipid panel at return visit.  - atorvastatin (LIPITOR) 10 MG tablet; Take 1 tablet (10 mg total) by mouth daily.  Dispense: 90 tablet; Refill: 1  2. Tobacco abuse  Continues.  3. Hypertension associated with diabetes (HCC)  Will get CMET at return visit. Counseled on cough and angioedema.  - lisinopril (PRINIVIL,ZESTRIL) 10 MG tablet; Take 1 tablet (10 mg total) by mouth daily.  Dispense: 90 tablet; Refill: 3  4. Need for Streptococcus pneumoniae vaccination  Updated today.  No Follow-up on file.  The entirety of the information documented in the History of Present Illness, Review of Systems and Physical Exam were personally obtained by me. Portions of this information were initially documented by Kavin Leech, CMA and reviewed by me for thoroughness and accuracy.          Trey Sailors, PA-C  [facility]

## 2017-08-13 NOTE — Patient Instructions (Signed)

## 2017-09-14 ENCOUNTER — Ambulatory Visit: Payer: No Typology Code available for payment source | Admitting: Physician Assistant

## 2017-09-17 ENCOUNTER — Encounter: Payer: Self-pay | Admitting: Physician Assistant

## 2017-09-28 ENCOUNTER — Ambulatory Visit: Payer: No Typology Code available for payment source | Admitting: Physician Assistant

## 2017-09-28 ENCOUNTER — Encounter: Payer: Self-pay | Admitting: Physician Assistant

## 2017-09-28 VITALS — BP 136/88 | HR 80 | Temp 98.3°F | Resp 16 | Wt 188.0 lb

## 2017-09-28 DIAGNOSIS — F1010 Alcohol abuse, uncomplicated: Secondary | ICD-10-CM | POA: Diagnosis not present

## 2017-09-28 DIAGNOSIS — E785 Hyperlipidemia, unspecified: Secondary | ICD-10-CM | POA: Diagnosis not present

## 2017-09-28 DIAGNOSIS — E1159 Type 2 diabetes mellitus with other circulatory complications: Secondary | ICD-10-CM | POA: Diagnosis not present

## 2017-09-28 DIAGNOSIS — I10 Essential (primary) hypertension: Secondary | ICD-10-CM | POA: Diagnosis not present

## 2017-09-28 DIAGNOSIS — E119 Type 2 diabetes mellitus without complications: Secondary | ICD-10-CM | POA: Diagnosis not present

## 2017-09-28 DIAGNOSIS — E1169 Type 2 diabetes mellitus with other specified complication: Secondary | ICD-10-CM

## 2017-09-28 MED ORDER — SIMVASTATIN 20 MG PO TABS
20.0000 mg | ORAL_TABLET | Freq: Every day | ORAL | 3 refills | Status: DC
Start: 2017-09-28 — End: 2018-03-17

## 2017-09-28 NOTE — Progress Notes (Signed)
Patient: Jose Barton Male    DOB: 02/24/1971   47 y.o.   MRN: [id number] Visit Date: 09/29/2017  Today's Provider: Trey SailorsAdriana M Pollak, PA-C   Chief Complaint  Patient presents with  . Hypertension  . Hyperlipidemia  . Emesis   Subjective:    Jose PainRussell O Szabo is a 47 y/o man with history of controlled Type II Dm, HLD, and HTN presenting today for follow up of the same.   Last visit, he was switched form simvastatin 20 mg to lipitor 10 mg due to LDL 154 not at goal. He was also started at Lisinopril 10 mg daily for microalbuminuria and HTN. He has stopped taking both of these medications last Thursday because he was vomiting in the morning.   He continues to drink alcohol. He says he is not a drunk or anything. He says he buys 25 oz cans of alcohol, he may drink one at night or he may drink three. He says it's still just one drink because it's in one can.   Hypertension  This is a chronic problem. Pertinent negatives include no anxiety, blurred vision, chest pain, headaches, malaise/fatigue, neck pain, orthopnea, peripheral edema, PND, shortness of breath or sweats. There are no associated agents to hypertension. Risk factors for coronary artery disease include male gender and dyslipidemia. Compliance problems include medication side effects.   Hyperlipidemia  This is a chronic problem. Pertinent negatives include no chest pain, focal sensory loss, focal weakness, leg pain, myalgias or shortness of breath. Current antihyperlipidemic treatment includes statins. Compliance problems include medication side effects.   Emesis   The current episode started more than 1 month ago (Pt reports vomiting started about a week after changing to atrovastatin.). The problem occurs less than 2 times per day (Pt reports vomiting only in the mornings. ). There has been no fever. Associated symptoms include diarrhea. Pertinent negatives include no abdominal pain, arthralgias, chest pain, chills,  coughing, dizziness, fever, headaches, myalgias, sweats, URI or weight loss.       Allergies  Allergen Reactions  . Codeine Hives  . Percocet [Oxycodone-Acetaminophen] Hives  . Vicodin [Hydrocodone-Acetaminophen] Hives     Current Outpatient Medications:  .  busPIRone (BUSPAR) 5 MG tablet, Take 5 mg by mouth 2 (two) times daily., Disp: , Rfl:  .  escitalopram (LEXAPRO) 20 MG tablet, Take 20 mg by mouth daily., Disp: , Rfl:  .  ibuprofen (ADVIL,MOTRIN) 800 MG tablet, Take 1 tablet (800 mg total) by mouth every 8 (eight) hours as needed for mild pain., Disp: 30 tablet, Rfl: 0 .  lisinopril (PRINIVIL,ZESTRIL) 10 MG tablet, Take 1 tablet (10 mg total) by mouth daily. (Patient not taking: Reported on 09/28/2017), Disp: 90 tablet, Rfl: 3 .  simvastatin (ZOCOR) 20 MG tablet, Take 1 tablet (20 mg total) by mouth at bedtime., Disp: 90 tablet, Rfl: 3  Review of Systems  Constitutional: Negative.  Negative for chills, fever, malaise/fatigue and weight loss.  Eyes: Negative for blurred vision.  Respiratory: Negative.  Negative for cough and shortness of breath.   Cardiovascular: Negative.  Negative for chest pain, orthopnea and PND.  Gastrointestinal: Positive for diarrhea, nausea and vomiting. Negative for abdominal pain, anal bleeding, blood in stool, constipation and rectal pain.  Musculoskeletal: Negative.  Negative for arthralgias, myalgias and neck pain.  Neurological: Negative for dizziness, focal weakness, light-headedness and headaches.   Family History  Problem Relation Age of Onset  . Lung cancer Mother   . Heart  attack Father   . Diabetes Father   . Hypertension Father   . Breast cancer Sister    Past Surgical History:  Procedure Laterality Date  . NO PAST SURGERIES      Social History   Tobacco Use  . Smoking status: Current Every Day Smoker    Packs/day: 0.50    Years: 30.00    Pack years: 15.00    Types: Cigarettes  . Smokeless tobacco: Never Used  Substance Use  Topics  . Alcohol use: Yes    Alcohol/week: 14.4 oz    Types: 24 Cans of beer per week    Comment: daily   Objective:   BP 136/88 (BP Location: Right Arm, Patient Position: Sitting, Cuff Size: Large)   Pulse 80   Temp 98.3 F (36.8 C) (Oral)   Resp 16   Wt 188 lb (85.3 kg)   BMI 28.59 kg/m  Vitals:   09/28/17 0832  BP: 136/88  Pulse: 80  Resp: 16  Temp: 98.3 F (36.8 C)  TempSrc: Oral  Weight: 188 lb (85.3 kg)     Physical Exam  Constitutional: He is oriented to person, place, and time. He appears well-developed and well-nourished.  Cardiovascular: Normal rate and regular rhythm.  Pulmonary/Chest: Effort normal and breath sounds normal.  Abdominal: Soft. Bowel sounds are normal.  Neurological: He is alert and oriented to person, place, and time.  Skin: Skin is warm and dry.  Psychiatric: He has a normal mood and affect. His behavior is normal.        Assessment & Plan:     1. Controlled type 2 diabetes mellitus without complication, without long-term current use of insulin (HCC)  Resume zocor. Possible lipitor was causing GI symptoms. - simvastatin (ZOCOR) 20 MG tablet; Take 1 tablet (20 mg total) by mouth at bedtime.  Dispense: 90 tablet; Refill: 3  2. Hypertension associated with diabetes (HCC)  Advised to wait a week until feeling better, start with Lisinopril. I doubt this was causing severe vomiting. See how he feels with it, do feel it is necessary for his HTN and microalbuminuria.   3. Hyperlipidemia associated with type 2 diabetes mellitus (HCC)  Back to zocor.  4. Alcohol abuse  Minimizes use today, says things like "It's still one drink if it's in one can," despite the can of beer being 25 oz. May drink anywhere from 25-75 oz per night. Says he does not plan on stopping. Then says he might cut back. Have counseled him that this constitutes alcohol abuse.  Return in about 2 months (around 11/28/2017) for DM, HTN.  The entirety of the information  documented in the History of Present Illness, Review of Systems and Physical Exam were personally obtained by me. Portions of this information were initially documented by Kavin Leech, CMA and reviewed by me for thoroughness and accuracy.           Trey Sailors, PA-C  [facility]

## 2017-09-28 NOTE — Patient Instructions (Signed)
Wait one week for symptoms to go away. Then, restart Lisinopril 10 mg ALONE for a week and see how you feel. Then, restart simvastatin 20 mg nightly.     Diabetes Mellitus and Exercise Exercising regularly is important for your overall health, especially when you have diabetes (diabetes mellitus). Exercising is not only about losing weight. It has many health benefits, such as increasing muscle strength and bone density and reducing body fat and stress. This leads to improved fitness, flexibility, and endurance, all of which result in better overall health. Exercise has additional benefits for people with diabetes, including:  Reducing appetite.  Helping to lower and control blood glucose.  Lowering blood pressure.  Helping to control amounts of fatty substances (lipids) in the blood, such as cholesterol and triglycerides.  Helping the body to respond better to insulin (improving insulin sensitivity).  Reducing how much insulin the body needs.  Decreasing the risk for heart disease by: ? Lowering cholesterol and triglyceride levels. ? Increasing the levels of good cholesterol. ? Lowering blood glucose levels.  What is my activity plan? Your health care provider or certified diabetes educator can help you make a plan for the type and frequency of exercise (activity plan) that works for you. Make sure that you:  Do at least 150 minutes of moderate-intensity or vigorous-intensity exercise each week. This could be brisk walking, biking, or water aerobics. ? Do stretching and strength exercises, such as yoga or weightlifting, at least 2 times a week. ? Spread out your activity over at least 3 days of the week.  Get some form of physical activity every day. ? Do not go more than 2 days in a row without some kind of physical activity. ? Avoid being inactive for more than 90 minutes at a time. Take frequent breaks to walk or stretch.  Choose a type of exercise or activity that you enjoy,  and set realistic goals.  Start slowly, and gradually increase the intensity of your exercise over time.  What do I need to know about managing my diabetes?  Check your blood glucose before and after exercising. ? If your blood glucose is higher than 240 mg/dL (16.113.3 mmol/L) before you exercise, check your urine for ketones. If you have ketones in your urine, do not exercise until your blood glucose returns to normal.  Know the symptoms of low blood glucose (hypoglycemia) and how to treat it. Your risk for hypoglycemia increases during and after exercise. Common symptoms of hypoglycemia can include: ? Hunger. ? Anxiety. ? Sweating and feeling clammy. ? Confusion. ? Dizziness or feeling light-headed. ? Increased heart rate or palpitations. ? Blurry vision. ? Tingling or numbness around the mouth, lips, or tongue. ? Tremors or shakes. ? Irritability.  Keep a rapid-acting carbohydrate snack available before, during, and after exercise to help prevent or treat hypoglycemia.  Avoid injecting insulin into areas of the body that are going to be exercised. For example, avoid injecting insulin into: ? The arms, when playing tennis. ? The legs, when jogging.  Keep records of your exercise habits. Doing this can help you and your health care provider adjust your diabetes management plan as needed. Write down: ? Food that you eat before and after you exercise. ? Blood glucose levels before and after you exercise. ? The type and amount of exercise you have done. ? When your insulin is expected to peak, if you use insulin. Avoid exercising at times when your insulin is peaking.  When you start  a new exercise or activity, work with your health care provider to make sure the activity is safe for you, and to adjust your insulin, medicines, or food intake as needed.  Drink plenty of water while you exercise to prevent dehydration or heat stroke. Drink enough fluid to keep your urine clear or pale  yellow. This information is not intended to replace advice given to you by your health care provider. Make sure you discuss any questions you have with your health care provider. Document Released: 08/22/2003 Document Revised: 12/20/2015 Document Reviewed: 11/11/2015 Elsevier Interactive Patient Education  2018 [employer]

## 2018-02-12 ENCOUNTER — Other Ambulatory Visit: Payer: Self-pay | Admitting: Physician Assistant

## 2018-02-15 NOTE — Telephone Encounter (Signed)
Gave 30 days. Was supposed to come back in June for follow up. No more refills without office visits. Please schedule for OV.

## 2018-02-16 NOTE — Telephone Encounter (Signed)
Na. vm not set up

## 2018-02-21 NOTE — Telephone Encounter (Signed)
NA. LVM.

## 2018-03-13 ENCOUNTER — Other Ambulatory Visit: Payer: Self-pay | Admitting: Physician Assistant

## 2018-03-16 ENCOUNTER — Other Ambulatory Visit: Payer: Self-pay | Admitting: Physician Assistant

## 2018-03-17 ENCOUNTER — Ambulatory Visit: Payer: No Typology Code available for payment source | Admitting: Physician Assistant

## 2018-03-17 ENCOUNTER — Encounter: Payer: Self-pay | Admitting: Physician Assistant

## 2018-03-17 VITALS — BP 140/90 | HR 84 | Temp 98.1°F | Resp 16 | Ht 68.0 in | Wt 184.0 lb

## 2018-03-17 DIAGNOSIS — E119 Type 2 diabetes mellitus without complications: Secondary | ICD-10-CM

## 2018-03-17 DIAGNOSIS — E1159 Type 2 diabetes mellitus with other circulatory complications: Secondary | ICD-10-CM

## 2018-03-17 DIAGNOSIS — E1169 Type 2 diabetes mellitus with other specified complication: Secondary | ICD-10-CM | POA: Diagnosis not present

## 2018-03-17 DIAGNOSIS — F419 Anxiety disorder, unspecified: Secondary | ICD-10-CM | POA: Diagnosis not present

## 2018-03-17 DIAGNOSIS — E785 Hyperlipidemia, unspecified: Secondary | ICD-10-CM

## 2018-03-17 DIAGNOSIS — I10 Essential (primary) hypertension: Secondary | ICD-10-CM

## 2018-03-17 LAB — POCT GLYCOSYLATED HEMOGLOBIN (HGB A1C)
Est. average glucose Bld gHb Est-mCnc: 128
Hemoglobin A1C: 6.1 % — AB (ref 4.0–5.6)

## 2018-03-17 MED ORDER — ESCITALOPRAM OXALATE 20 MG PO TABS: 20.0000 mg | ORAL_TABLET | Freq: Every day | ORAL | 0 refills | Status: DC

## 2018-03-17 MED ORDER — LISINOPRIL 10 MG PO TABS: 10.0000 mg | ORAL_TABLET | Freq: Every day | ORAL | 1 refills | Status: DC

## 2018-03-17 MED ORDER — SIMVASTATIN 20 MG PO TABS: 20.0000 mg | ORAL_TABLET | Freq: Every day | ORAL | 3 refills | Status: DC

## 2018-03-17 MED ORDER — BUSPIRONE HCL 5 MG PO TABS: 5.0000 mg | ORAL_TABLET | Freq: Two times a day (BID) | ORAL | 0 refills | Status: AC

## 2018-03-17 NOTE — Progress Notes (Signed)
Patient: Jose Barton Male    DOB: 1970/10/05   47 y.o.   MRN: [id number] Visit Date: 03/17/2018  Today's Provider: Trey Sailors, PA-C   Chief Complaint  Patient presents with  . Diabetes  . Anxiety  . Hyperlipidemia   Subjective:    HPI  Diabetes Mellitus Type II, Follow-up:   Lab Results  Component Value Date   HGBA1C 6.1 (A) 03/17/2018   HGBA1C 6.1 07/13/2017   Last seen for diabetes 5 months ago.  Management since then includes no changes. He reports good compliance with treatment. He is not having side effects.  Current symptoms include none and have been stable. Home blood sugar records: fasting range: not being checked  Episodes of hypoglycemia? no   Current Insulin Regimen: none Most Recent Eye Exam:  Weight trend: stable Prior visit with dietician: no Current diet: in general, a "healthy" diet   Current exercise: no regular exercise  Wt Readings from Last 3 Encounters:  03/17/18 184 lb (83.5 kg)  09/28/17 188 lb (85.3 kg)  08/13/17 193 lb (87.5 kg)    ------------------------------------------------------------------------   Hypertension, follow-up:  BP Readings from Last 3 Encounters:  03/17/18 140/90  09/28/17 136/88  08/13/17 (!) 148/96    He was last seen for hypertension 5 months ago.  BP at that visit was 136/88. Management since that visit includes no changes.He reports fair compliance with treatment. Ran out of medication for the past several days.  He is not having side effects.  He is not exercising. He is not adherent to low salt diet.   Outside blood pressures are not being checked. He is experiencing none.  Patient denies chest pain.   Cardiovascular risk factors include diabetes mellitus, dyslipidemia, hypertension and smoking/ tobacco exposure.  Use of agents associated with hypertension: none.   ------------------------------------------------------------------------    Lipid/Cholesterol, Follow-up:    Last seen for this 5 months ago.  Management since that visit includes no chagnes.  Last Lipid Panel:    Component Value Date/Time   CHOL 233 (H) 07/13/2017 1010   TRIG 178 (H) 07/13/2017 1010   HDL 43 07/13/2017 1010   CHOLHDL 5.4 (H) 07/13/2017 1010   LDLCALC 154 (H) 07/13/2017 1010    He reports excellent compliance with treatment. He is not having side effects.   Wt Readings from Last 3 Encounters:  03/17/18 184 lb (83.5 kg)  09/28/17 188 lb (85.3 kg)  08/13/17 193 lb (87.5 kg)    ------------------------------------------------------------------------      Allergies  Allergen Reactions  . Codeine Hives  . Percocet [Oxycodone-Acetaminophen] Hives  . Vicodin [Hydrocodone-Acetaminophen] Hives  . Bupropion Swelling and Hives     Current Outpatient Medications:  .  busPIRone (BUSPAR) 5 MG tablet, TAKE ONE TABLET TWICE DAILY, Disp: 60 tablet, Rfl: 0 .  escitalopram (LEXAPRO) 20 MG tablet, TAKE ONE (1) TABLET EACH DAY, Disp: 30 tablet, Rfl: 0 .  ibuprofen (ADVIL,MOTRIN) 800 MG tablet, Take 1 tablet (800 mg total) by mouth every 8 (eight) hours as needed for mild pain., Disp: 30 tablet, Rfl: 0 .  lisinopril (PRINIVIL,ZESTRIL) 10 MG tablet, Take 1 tablet (10 mg total) by mouth daily., Disp: 90 tablet, Rfl: 3 .  simvastatin (ZOCOR) 20 MG tablet, Take 1 tablet (20 mg total) by mouth at bedtime., Disp: 90 tablet, Rfl: 3  Review of Systems  Constitutional: Negative.   HENT: Negative.   Respiratory: Negative.   Cardiovascular: Negative.   Endocrine: Negative.  Social History   Tobacco Use  . Smoking status: Current Every Day Smoker    Packs/day: 0.50    Years: 30.00    Pack years: 15.00    Types: Cigarettes  . Smokeless tobacco: Never Used  Substance Use Topics  . Alcohol use: Yes    Alcohol/week: 24.0 standard drinks    Types: 24 Cans of beer per week    Comment: daily   Objective:   BP 140/90 (BP Location: Left Arm, Patient Position: Sitting, Cuff  Size: Normal)   Pulse 84   Temp 98.1 F (36.7 C) (Oral)   Resp 16   Ht 5\' 8"  (1.727 m)   Wt 184 lb (83.5 kg)   SpO2 98%   BMI 27.98 kg/m  Vitals:   03/17/18 0811  BP: 140/90  Pulse: 84  Resp: 16  Temp: 98.1 F (36.7 C)  TempSrc: Oral  SpO2: 98%  Weight: 184 lb (83.5 kg)  Height: 5\' 8"  (1.727 m)     Physical Exam  Constitutional: He is oriented to person, place, and time. He appears well-developed and well-nourished.  Cardiovascular: Normal rate and regular rhythm.  Pulmonary/Chest: Effort normal and breath sounds normal.  Neurological: He is alert and oriented to person, place, and time.  Skin: Skin is warm and dry.  Psychiatric: He has a normal mood and affect. His behavior is normal.        Assessment & Plan:     1. Controlled type 2 diabetes mellitus without complication, without long-term current use of insulin (HCC)  - POCT glycosylated hemoglobin (Hb A1C) - simvastatin (ZOCOR) 20 MG tablet; Take 1 tablet (20 mg total) by mouth at bedtime.  Dispense: 90 tablet; Refill: 3 - Lipid Profile - Comprehensive Metabolic Panel (CMET)  2. Hypertension associated with diabetes (HCC)  - lisinopril (PRINIVIL,ZESTRIL) 10 MG tablet; Take 1 tablet (10 mg total) by mouth daily.  Dispense: 90 tablet; Refill: 1  3. Anxiety  - escitalopram (LEXAPRO) 20 MG tablet; Take 1 tablet (20 mg total) by mouth daily.  Dispense: 90 tablet; Refill: 0 - busPIRone (BUSPAR) 5 MG tablet; Take 1 tablet (5 mg total) by mouth 2 (two) times daily.  Dispense: 180 tablet; Refill: 0  4. Hyperlipidemia associated with type 2 diabetes mellitus (HCC)  Will adjust medication pending labwork. He is tolerating this statin better than Lipitor.   Return for CPE, follow up .  The entirety of the information documented in the History of Present Illness, Review of Systems and Physical Exam were personally obtained by me. Portions of this information were initially documented by Rondel Baton, CMA and  reviewed by me for thoroughness and accuracy.        Trey Sailors, PA-C  [facility]

## 2018-03-18 ENCOUNTER — Telehealth: Payer: Self-pay

## 2018-03-18 DIAGNOSIS — E785 Hyperlipidemia, unspecified: Secondary | ICD-10-CM

## 2018-03-18 DIAGNOSIS — E1169 Type 2 diabetes mellitus with other specified complication: Secondary | ICD-10-CM | POA: Insufficient documentation

## 2018-03-18 LAB — COMPREHENSIVE METABOLIC PANEL
ALT: 19 IU/L (ref 0–44)
AST: 20 IU/L (ref 0–40)
Albumin/Globulin Ratio: 2.1 (ref 1.2–2.2)
Albumin: 4.4 g/dL (ref 3.5–5.5)
Alkaline Phosphatase: 96 IU/L (ref 39–117)
BUN/Creatinine Ratio: 24 — ABNORMAL HIGH (ref 9–20)
BUN: 19 mg/dL (ref 6–24)
Bilirubin Total: 0.3 mg/dL (ref 0.0–1.2)
CO2: 22 mmol/L (ref 20–29)
Calcium: 9.2 mg/dL (ref 8.7–10.2)
Chloride: 102 mmol/L (ref 96–106)
Creatinine, Ser: 0.79 mg/dL (ref 0.76–1.27)
GFR calc Af Amer: 124 mL/min/{1.73_m2} (ref >59)
GFR calc non Af Amer: 107 mL/min/{1.73_m2} (ref >59)
Globulin, Total: 2.1 g/dL (ref 1.5–4.5)
Glucose: 112 mg/dL — ABNORMAL HIGH (ref 65–99)
Potassium: 4.4 mmol/L (ref 3.5–5.2)
Sodium: 140 mmol/L (ref 134–144)
Total Protein: 6.5 g/dL (ref 6.0–8.5)

## 2018-03-18 LAB — LIPID PANEL
Chol/HDL Ratio: 3.8 ratio (ref 0.0–5.0)
Cholesterol, Total: 162 mg/dL (ref 100–199)
HDL: 43 mg/dL (ref >39)
LDL Calculated: 106 mg/dL — ABNORMAL HIGH (ref 0–99)
Triglycerides: 66 mg/dL (ref 0–149)
VLDL Cholesterol Cal: 13 mg/dL (ref 5–40)

## 2018-03-18 NOTE — Telephone Encounter (Signed)
-----   Message from Trey Sailors, [place] sent at 03/18/2018  8:28 AM EDT ----- Cholesterol significantly improved but still not to goal for somebody with diabetes. Recommend increasing simvastatin to 40 mg daily. I will send this in for him if he is agreeable. CMET normal.

## 2018-03-18 NOTE — Telephone Encounter (Signed)
LMTCB   ----- Message from Trey Sailors, PA-C sent at 03/18/2018  8:28 AM EDT ----- Cholesterol significantly improved but still not to goal for somebody with diabetes. Recommend increasing simvastatin to 40 mg daily. I will send this in for him if he is agreeable. CMET normal.

## 2018-03-18 NOTE — Patient Instructions (Signed)

## 2018-03-22 NOTE — Telephone Encounter (Signed)
lmtcb

## 2018-03-25 ENCOUNTER — Ambulatory Visit: Payer: No Typology Code available for payment source | Admitting: Physician Assistant

## 2018-03-25 ENCOUNTER — Encounter: Payer: Self-pay | Admitting: Physician Assistant

## 2018-03-25 VITALS — BP 160/100 | HR 81 | Temp 98.2°F | Resp 16 | Wt 198.0 lb

## 2018-03-25 DIAGNOSIS — A084 Viral intestinal infection, unspecified: Secondary | ICD-10-CM | POA: Diagnosis not present

## 2018-03-25 DIAGNOSIS — R079 Chest pain, unspecified: Secondary | ICD-10-CM

## 2018-03-25 DIAGNOSIS — R05 Cough: Secondary | ICD-10-CM | POA: Diagnosis not present

## 2018-03-25 DIAGNOSIS — R059 Cough, unspecified: Secondary | ICD-10-CM

## 2018-03-25 NOTE — Progress Notes (Signed)
Patient: Jose Barton Male    DOB: 1971/03/05   47 y.o.   MRN: [id number] Visit Date: 03/29/2018  Today's Provider: Trey Sailors, PA-C   Chief Complaint  Patient presents with  . Cough   Subjective:    HPI Cough: Has a history of controlled type II DM and HTN. Reports today that he feels like something is sitting on his chest. Reports he woke up at 3 AM and vomited 4-5 times throughout the night, after which he started having chest pain. Says it does not hurt constantly, but that it hurts diffusely when he breathes. Patient complains of nonproductive cough.  Symptoms began 2 days ago.  The cough is non-productive, with shortness of breath and is aggravated by nothing Associated symptoms include:chest pain and shortness of breath. Patient does not have new pets. Patient does not have a history of asthma. Patient does not have a history of environmental allergens. Patient does not have recent travel. Patient does have a history of smoking. Patient  does not have previous Chest X-ray.       Allergies  Allergen Reactions  . Codeine Hives  . Percocet [Oxycodone-Acetaminophen] Hives  . Vicodin [Hydrocodone-Acetaminophen] Hives  . Bupropion Swelling and Hives     Current Outpatient Medications:  .  busPIRone (BUSPAR) 5 MG tablet, Take 1 tablet (5 mg total) by mouth 2 (two) times daily., Disp: 180 tablet, Rfl: 0 .  escitalopram (LEXAPRO) 20 MG tablet, Take 1 tablet (20 mg total) by mouth daily., Disp: 90 tablet, Rfl: 0 .  ibuprofen (ADVIL,MOTRIN) 800 MG tablet, Take 1 tablet (800 mg total) by mouth every 8 (eight) hours as needed for mild pain., Disp: 30 tablet, Rfl: 0 .  lisinopril (PRINIVIL,ZESTRIL) 10 MG tablet, Take 1 tablet (10 mg total) by mouth daily., Disp: 90 tablet, Rfl: 1 .  simvastatin (ZOCOR) 20 MG tablet, Take 1 tablet (20 mg total) by mouth at bedtime., Disp: 90 tablet, Rfl: 3  Review of Systems  Constitutional: Negative.   HENT: Positive for congestion.     Respiratory: Positive for cough, chest tightness and shortness of breath.     Social History   Tobacco Use  . Smoking status: Current Every Day Smoker    Packs/day: 0.50    Years: 30.00    Pack years: 15.00    Types: Cigarettes  . Smokeless tobacco: Never Used  Substance Use Topics  . Alcohol use: Yes    Alcohol/week: 24.0 standard drinks    Types: 24 Cans of beer per week    Comment: daily   Objective:   BP (!) 160/100 (BP Location: Left Arm, Patient Position: Sitting, Cuff Size: Large)   Pulse 81   Temp 98.2 F (36.8 C) (Oral)   Resp 16   Wt 198 lb (89.8 kg)   SpO2 97%   BMI 30.11 kg/m  Vitals:   03/25/18 0829  BP: (!) 160/100  Pulse: 81  Resp: 16  Temp: 98.2 F (36.8 C)  TempSrc: Oral  SpO2: 97%  Weight: 198 lb (89.8 kg)     Physical Exam  Constitutional: He is oriented to person, place, and time. He appears well-developed and well-nourished.  Cardiovascular: Normal rate and regular rhythm.  Pulmonary/Chest: Effort normal and breath sounds normal.  Musculoskeletal: He exhibits no tenderness.  Neurological: He is alert and oriented to person, place, and time.  Skin: Skin is warm and dry.  Psychiatric: He has a normal mood and affect. His behavior  is normal.        Assessment & Plan:     1. Viral gastroenteritis  Suspect MSK pain due to vomiting and coughing. EKG in office is reassuring, counseled patient as below. His vitals except for some mild hypertension are normal.   2. Chest pain, unspecified type  EKG reviewed and is normal today. Counseled patient that it is possible he could be having an NSTEMI which we would not know without bloodwork. Patient declines getting bloodwork and imaging, says he would rather wait and see if he feels better.   - EKG 12-Lead - DG Chest 2 View; Future  3. Cough  - DG Chest 2 View; Future  Return if symptoms worsen or fail to improve.  The entirety of the information documented in the History of Present  Illness, Review of Systems and Physical Exam were personally obtained by me. Portions of this information were initially documented by Rondel Baton, CMA and reviewed by me for thoroughness and accuracy.   I have spent 25 minutes with this patient, >50% of which was spent on counseling and coordination of care.      Trey Sailors, PA-C  [facility]

## 2018-03-25 NOTE — Telephone Encounter (Signed)
Patient advised in office today. sd

## 2018-03-25 NOTE — Patient Instructions (Signed)
Chest Wall Pain °Chest wall pain is pain in or around the bones and muscles of your chest. Sometimes, an injury causes this pain. Sometimes, the cause may not be known. This pain may take several weeks or longer to get better. °Follow these instructions at home: °Pay attention to any changes in your symptoms. Take these actions to help with your pain: °· Rest as told by your doctor. °· Avoid activities that cause pain. Try not to use your chest, belly (abdominal), or side muscles to lift heavy things. °· If directed, apply ice to the painful area: °? Put ice in a plastic bag. °? Place a towel between your skin and the bag. °? Leave the ice on for 20 minutes, 2-3 times per day. °· Take over-the-counter and prescription medicines only as told by your doctor. °· Do not use tobacco products, including cigarettes, chewing tobacco, and e-cigarettes. If you need help quitting, ask your doctor. °· Keep all follow-up visits as told by your doctor. This is important. ° °Contact a doctor if: °· You have a fever. °· Your chest pain gets worse. °· You have new symptoms. °Get help right away if: °· You feel sick to your stomach (nauseous) or you throw up (vomit). °· You feel sweaty or light-headed. °· You have a cough with phlegm (sputum) or you cough up blood. °· You are short of breath. °This information is not intended to replace advice given to you by your health care provider. Make sure you discuss any questions you have with your health care provider. °Document Released: 11/18/2007 Document Revised: 11/07/2015 Document Reviewed: 08/27/2014 °Elsevier Interactive Patient Education © 2018 Else[employer] Inc. ° °

## 2018-03-29 ENCOUNTER — Telehealth: Payer: Self-pay

## 2018-03-29 ENCOUNTER — Ambulatory Visit: Payer: No Typology Code available for payment source | Admitting: Physician Assistant

## 2018-03-29 NOTE — Telephone Encounter (Signed)
Patient no showed to appointment today. I called him and he reports he is feeling much better. sd

## 2018-06-17 ENCOUNTER — Ambulatory Visit (INDEPENDENT_AMBULATORY_CARE_PROVIDER_SITE_OTHER): Payer: No Typology Code available for payment source | Admitting: Physician Assistant

## 2018-06-17 ENCOUNTER — Encounter: Payer: Self-pay | Admitting: Physician Assistant

## 2018-06-17 VITALS — BP 143/91 | HR 93 | Temp 98.6°F | Wt 182.6 lb

## 2018-06-17 DIAGNOSIS — E1159 Type 2 diabetes mellitus with other circulatory complications: Secondary | ICD-10-CM

## 2018-06-17 DIAGNOSIS — Z1329 Encounter for screening for other suspected endocrine disorder: Secondary | ICD-10-CM

## 2018-06-17 DIAGNOSIS — I10 Essential (primary) hypertension: Secondary | ICD-10-CM

## 2018-06-17 DIAGNOSIS — Z130 Encounter for screening for diseases of the blood and blood-forming organs and certain disorders involving the immune mechanism: Secondary | ICD-10-CM

## 2018-06-17 DIAGNOSIS — E785 Hyperlipidemia, unspecified: Secondary | ICD-10-CM

## 2018-06-17 DIAGNOSIS — F419 Anxiety disorder, unspecified: Secondary | ICD-10-CM | POA: Diagnosis not present

## 2018-06-17 DIAGNOSIS — Z0000 Encounter for general adult medical examination without abnormal findings: Secondary | ICD-10-CM | POA: Diagnosis not present

## 2018-06-17 DIAGNOSIS — E1169 Type 2 diabetes mellitus with other specified complication: Secondary | ICD-10-CM

## 2018-06-17 DIAGNOSIS — E119 Type 2 diabetes mellitus without complications: Secondary | ICD-10-CM

## 2018-06-17 MED ORDER — ESCITALOPRAM OXALATE 20 MG PO TABS: 20.0000 mg | ORAL_TABLET | Freq: Every day | ORAL | 0 refills | Status: DC

## 2018-06-17 MED ORDER — LISINOPRIL 20 MG PO TABS: 20.0000 mg | ORAL_TABLET | Freq: Every day | ORAL | 0 refills | Status: DC

## 2018-06-17 MED ORDER — SIMVASTATIN 40 MG PO TABS: 40.0000 mg | ORAL_TABLET | Freq: Every day | ORAL | 0 refills | Status: DC

## 2018-06-17 NOTE — Progress Notes (Signed)
Patient: Jose Barton, Male    DOB: [birth year], 48 y.o.   MRN: [id number] Visit Date: 06/24/2018  Today's Provider: Trey SailorsAdriana M Andreyah Natividad, PA-C   Chief Complaint  Patient presents with  . Annual Exam   Subjective:    Annual physical exam Jose PainRussell O Decandia is a 48 y.o. male who presents today for health maintenance and complete physical. He feels well. He reports exercising includes walking. He reports he is sleeping well.   Wt Readings from Last 3 Encounters:  06/17/18 182 lb 9.6 oz (82.8 kg)  03/25/18 198 lb (89.8 kg)  03/17/18 184 lb (83.5 kg)   Denies family history colon cancer/prostate cancer.  HTN: Improved 10 mg lisinopril. Tolerating well. Denies Chest Pain, H/A, Sob.  BP Readings from Last 3 Encounters:  06/17/18 (!) 143/91  03/25/18 (!) 160/100  03/17/18 140/90   DM II  Lab Results  Component Value Date   HGBA1C 5.9 (H) 06/17/2018   HLD:  Simvastatin  40 mg.  Lipid Panel     Component Value Date/Time   CHOL 190 06/17/2018 1049   TRIG 134 06/17/2018 1049   HDL 46 06/17/2018 1049   CHOLHDL 4.1 06/17/2018 1049   LDLCALC 117 (H) 06/17/2018 1049    cialis once a month for diabetes 2 alcoholic drinks per night 1 pack a day Interested in quitting chantix or wellbutrin - quit on lozenges  cialis for diabetes??  -----------------------------------------------------------------   Review of Systems  Constitutional: Negative.   HENT: Negative.   Eyes: Positive for visual disturbance.  Respiratory: Negative.   Cardiovascular: Negative.   Gastrointestinal: Negative.   Endocrine: Negative.   Genitourinary: Negative.   Musculoskeletal: Negative.   Skin: Negative.   Allergic/Immunologic: Negative.   Neurological: Negative.   Hematological: Negative.   Psychiatric/Behavioral: Negative.     Social History He  reports that he has been smoking cigarettes. He has a 15.00 pack-year smoking history. He has never used smokeless tobacco. He reports  current alcohol use of about 24.0 standard drinks of alcohol per week. He reports that he does not use drugs. Social History   Socioeconomic History  . Marital status: Married    Spouse name: Not on file  . Number of children: Not on file  . Years of education: Not on file  . Highest education level: Not on file  Occupational History  . Not on file  Social Needs  . Financial resource strain: Not on file  . Food insecurity:    Worry: Not on file    Inability: Not on file  . Transportation needs:    Medical: Not on file    Non-medical: Not on file  Tobacco Use  . Smoking status: Current Every Day Smoker    Packs/day: 0.50    Years: 30.00    Pack years: 15.00    Types: Cigarettes  . Smokeless tobacco: Never Used  Substance and Sexual Activity  . Alcohol use: Yes    Alcohol/week: 24.0 standard drinks    Types: 24 Cans of beer per week    Comment: daily  . Drug use: No  . Sexual activity: Not on file  Lifestyle  . Physical activity:    Days per week: Not on file    Minutes per session: Not on file  . Stress: Not on file  Relationships  . Social connections:    Talks on phone: Not on file    Gets together: Not on file    Attends  religious service: Not on file    Active member of club or organization: Not on file    Attends meetings of clubs or organizations: Not on file    Relationship status: Not on file  Other Topics Concern  . Not on file  Social History Narrative  . Not on file    Patient Active Problem List   Diagnosis Date Noted  . Hyperlipidemia associated with type 2 diabetes mellitus (HCC) 03/18/2018  . Hypertension associated with diabetes (HCC) 08/13/2017  . Alcohol abuse 07/13/2017  . Tobacco abuse 07/13/2017  . Anxiety 07/13/2017  . Controlled type 2 diabetes mellitus without complication, without long-term current use of insulin (HCC) 07/13/2017  . Severe diabetic hypoglycemia (HCC) 08/01/2014    Past Surgical History:  Procedure Laterality Date   . NO PAST SURGERIES      Family History  Family Status  Relation Name Status  . Mother  Deceased at age 48  . Father  Deceased at age 48  . Sister  Alive   His family history includes Breast cancer in his sister; Diabetes in his father; Heart attack in his father; Hypertension in his father; Lung cancer in his mother.     Allergies  Allergen Reactions  . Codeine Hives  . Percocet [Oxycodone-Acetaminophen] Hives  . Vicodin [Hydrocodone-Acetaminophen] Hives  . Bupropion Swelling and Hives    Previous Medications   IBUPROFEN (ADVIL,MOTRIN) 800 MG TABLET    Take 1 tablet (800 mg total) by mouth every 8 (eight) hours as needed for mild pain.    Patient Care Team: Maryella ShiversPollak, Amyiah Gaba M, PA-C as PCP - General (Physician Assistant)      Objective:   Vitals: BP (!) 143/91 (BP Location: Left Arm, Patient Position: Sitting, Cuff Size: Large)   Pulse 93   Temp 98.6 F (37 C) (Oral)   Wt 182 lb 9.6 oz (82.8 kg)   SpO2 97%   BMI 27.76 kg/m    Physical Exam Constitutional:      Appearance: Normal appearance.  HENT:     Right Ear: Tympanic membrane and ear canal normal.     Left Ear: Tympanic membrane and ear canal normal.     Mouth/Throat:     Mouth: Mucous membranes are dry.  Neck:     Musculoskeletal: Neck supple.  Cardiovascular:     Rate and Rhythm: Normal rate and regular rhythm.     Heart sounds: Normal heart sounds.  Pulmonary:     Effort: Pulmonary effort is normal.     Breath sounds: Normal breath sounds.  Neurological:     General: No focal deficit present.     Mental Status: He is alert and oriented to person, place, and time. Mental status is at baseline.  Psychiatric:        Mood and Affect: Mood normal.        Behavior: Behavior normal.      Depression Screen PHQ 2/9 Scores 06/17/2018 07/13/2017  PHQ - 2 Score 0 0  PHQ- 9 Score 0 2   Diabetic Foot Exam - Simple   Simple Foot Form Diabetic Foot exam was performed with the following findings:  Yes  06/17/2018 10:20 AM  Visual Inspection No deformities, no ulcerations, no other skin breakdown bilaterally:  Yes Sensation Testing Intact to touch and monofilament testing bilaterally:  Yes Pulse Check Posterior Tibialis and Dorsalis pulse intact bilaterally:  Yes Comments       Assessment & Plan:     Routine Health Maintenance and  Physical Exam  Exercise Activities and Dietary recommendations Goals   None     Immunization History  Administered Date(s) Administered  . Pneumococcal Polysaccharide-23 08/13/2017  . Tdap 04/24/2014    Health Maintenance  Topic Date Due  . HIV Screening  07/13/2018 (Originally 11/03/1985)  . INFLUENZA VACCINE  09/13/2018 (Originally 01/13/2018)  . OPHTHALMOLOGY EXAM  08/09/2018  . HEMOGLOBIN A1C  12/16/2018  . FOOT EXAM  06/18/2019  . TETANUS/TDAP  04/24/2024  . PNEUMOCOCCAL POLYSACCHARIDE VACCINE AGE 20-64 HIGH RISK  Completed     Discussed health benefits of physical activity, and encouraged him to engage in regular exercise appropriate for his age and condition.    1. Annual physical exam   2. Controlled type 2 diabetes mellitus without complication, without long-term current use of insulin (HCC)  - HgB A1c  3. Hypertension associated with diabetes (HCC)  Increase Lisinopril to 20 mg daily.   - Comprehensive Metabolic Panel (CMET) - lisinopril (PRINIVIL,ZESTRIL) 20 MG tablet; Take 1 tablet (20 mg total) by mouth daily.  Dispense: 90 tablet; Refill: 0  4. Hyperlipidemia associated with type 2 diabetes mellitus (HCC)  - Lipid Profile - simvastatin (ZOCOR) 40 MG tablet; Take 1 tablet (40 mg total) by mouth at bedtime.  Dispense: 90 tablet; Refill: 0  5. Thyroid disorder screening   6. Screening for deficiency anemia   7. Anxiety  Stable, refill as below.  - escitalopram (LEXAPRO) 20 MG tablet; Take 1 tablet (20 mg total) by mouth daily.  Dispense: 90 tablet; Refill: 0    The entirety of the information documented in  the History of Present Illness, Review of Systems and Physical Exam were personally obtained by me. Portions of this information were initially documented by Joseline ROsas, CMA and reviewed by me for thoroughness and accuracy.      --------------------------------------------------------------------

## 2018-06-17 NOTE — Patient Instructions (Signed)
Diabetes Mellitus and Exercise Exercising regularly is important for your overall health, especially when you have diabetes (diabetes mellitus). Exercising is not only about losing weight. It has many other health benefits, such as increasing muscle strength and bone density and reducing body fat and stress. This leads to improved fitness, flexibility, and endurance, all of which result in better overall health. Exercise has additional benefits for people with diabetes, including:  Reducing appetite.  Helping to lower and control blood glucose.  Lowering blood pressure.  Helping to control amounts of fatty substances (lipids) in the blood, such as cholesterol and triglycerides.  Helping the body to respond better to insulin (improving insulin sensitivity).  Reducing how much insulin the body needs.  Decreasing the risk for heart disease by: ? Lowering cholesterol and triglyceride levels. ? Increasing the levels of good cholesterol. ? Lowering blood glucose levels. What is my activity plan? Your health care provider or certified diabetes educator can help you make a plan for the type and frequency of exercise (activity plan) that works for you. Make sure that you:  Do at least 150 minutes of moderate-intensity or vigorous-intensity exercise each week. This could be brisk walking, biking, or water aerobics. ? Do stretching and strength exercises, such as yoga or weightlifting, at least 2 times a week. ? Spread out your activity over at least 3 days of the week.  Get some form of physical activity every day. ? Do not go more than 2 days in a row without some kind of physical activity. ? Avoid being inactive for more than 30 minutes at a time. Take frequent breaks to walk or stretch.  Choose a type of exercise or activity that you enjoy, and set realistic goals.  Start slowly, and gradually increase the intensity of your exercise over time. What do I need to know about managing my  diabetes?   Check your blood glucose before and after exercising. ? If your blood glucose is 240 mg/dL (13.3 mmol/L) or higher before you exercise, check your urine for ketones. If you have ketones in your urine, do not exercise until your blood glucose returns to normal. ? If your blood glucose is 100 mg/dL (5.6 mmol/L) or lower, eat a snack containing 15-20 grams of carbohydrate. Check your blood glucose 15 minutes after the snack to make sure that your level is above 100 mg/dL (5.6 mmol/L) before you start your exercise.  Know the symptoms of low blood glucose (hypoglycemia) and how to treat it. Your risk for hypoglycemia increases during and after exercise. Common symptoms of hypoglycemia can include: ? Hunger. ? Anxiety. ? Sweating and feeling clammy. ? Confusion. ? Dizziness or feeling light-headed. ? Increased heart rate or palpitations. ? Blurry vision. ? Tingling or numbness around the mouth, lips, or tongue. ? Tremors or shakes. ? Irritability.  Keep a rapid-acting carbohydrate snack available before, during, and after exercise to help prevent or treat hypoglycemia.  Avoid injecting insulin into areas of the body that are going to be exercised. For example, avoid injecting insulin into: ? The arms, when playing tennis. ? The legs, when jogging.  Keep records of your exercise habits. Doing this can help you and your health care provider adjust your diabetes management plan as needed. Write down: ? Food that you eat before and after you exercise. ? Blood glucose levels before and after you exercise. ? The type and amount of exercise you have done. ? When your insulin is expected to peak, if you use   insulin. Avoid exercising at times when your insulin is peaking.  When you start a new exercise or activity, work with your health care provider to make sure the activity is safe for you, and to adjust your insulin, medicines, or food intake as needed.  Drink plenty of water while  you exercise to prevent dehydration or heat stroke. Drink enough fluid to keep your urine clear or pale yellow. Summary  Exercising regularly is important for your overall health, especially when you have diabetes (diabetes mellitus).  Exercising has many health benefits, such as increasing muscle strength and bone density and reducing body fat and stress.  Your health care provider or certified diabetes educator can help you make a plan for the type and frequency of exercise (activity plan) that works for you.  When you start a new exercise or activity, work with your health care provider to make sure the activity is safe for you, and to adjust your insulin, medicines, or food intake as needed. This information is not intended to replace advice given to you by your health care provider. Make sure you discuss any questions you have with your health care provider. Document Released: 08/22/2003 Document Revised: 12/10/2016 Document Reviewed: 11/11/2015 Elsevier Interactive Patient Education  2019 [employer] Inc.  

## 2018-06-18 LAB — COMPREHENSIVE METABOLIC PANEL
ALT: 20 IU/L (ref 0–44)
AST: 15 IU/L (ref 0–40)
Albumin/Globulin Ratio: 2.3 — ABNORMAL HIGH (ref 1.2–2.2)
Albumin: 4.6 g/dL (ref 3.5–5.5)
Alkaline Phosphatase: 91 IU/L (ref 39–117)
BUN/Creatinine Ratio: 27 — ABNORMAL HIGH (ref 9–20)
BUN: 22 mg/dL (ref 6–24)
Bilirubin Total: 0.2 mg/dL (ref 0.0–1.2)
CO2: 23 mmol/L (ref 20–29)
Calcium: 9.6 mg/dL (ref 8.7–10.2)
Chloride: 102 mmol/L (ref 96–106)
Creatinine, Ser: 0.82 mg/dL (ref 0.76–1.27)
GFR calc Af Amer: 122 mL/min/{1.73_m2} (ref >59)
GFR calc non Af Amer: 105 mL/min/{1.73_m2} (ref >59)
Globulin, Total: 2 g/dL (ref 1.5–4.5)
Glucose: 125 mg/dL — ABNORMAL HIGH (ref 65–99)
Potassium: 4.5 mmol/L (ref 3.5–5.2)
Sodium: 139 mmol/L (ref 134–144)
Total Protein: 6.6 g/dL (ref 6.0–8.5)

## 2018-06-18 LAB — LIPID PANEL
Chol/HDL Ratio: 4.1 ratio (ref 0.0–5.0)
Cholesterol, Total: 190 mg/dL (ref 100–199)
HDL: 46 mg/dL (ref >39)
LDL Calculated: 117 mg/dL — ABNORMAL HIGH (ref 0–99)
Triglycerides: 134 mg/dL (ref 0–149)
VLDL Cholesterol Cal: 27 mg/dL (ref 5–40)

## 2018-06-18 LAB — HEMOGLOBIN A1C
Est. average glucose Bld gHb Est-mCnc: 123 mg/dL
Hgb A1c MFr Bld: 5.9 % — ABNORMAL HIGH (ref 4.8–5.6)

## 2018-06-22 ENCOUNTER — Telehealth: Payer: Self-pay

## 2018-06-22 NOTE — Telephone Encounter (Signed)
-----   Message from Trey Sailors, [place] sent at 06/21/2018  5:13 PM EST ----- A1c is 5.9 which is great. Cholesterol slightly above goal but he is on maximum of his medication. Recommend switching to 20 mg lipitor nightly which I can send in if he is agreeable.

## 2018-06-22 NOTE — Telephone Encounter (Signed)
[id number] 

## 2018-06-23 NOTE — Telephone Encounter (Signed)
Patient advised as below. Patient wants to continue simvastatin 40 mg.

## 2018-08-30 ENCOUNTER — Encounter: Payer: Self-pay | Admitting: Physician Assistant

## 2018-08-30 ENCOUNTER — Ambulatory Visit: Payer: No Typology Code available for payment source | Admitting: Physician Assistant

## 2018-08-30 ENCOUNTER — Other Ambulatory Visit: Payer: Self-pay

## 2018-08-30 VITALS — BP 142/93 | HR 98 | Temp 98.6°F | Resp 16 | Wt 180.4 lb

## 2018-08-30 DIAGNOSIS — L732 Hidradenitis suppurativa: Secondary | ICD-10-CM | POA: Diagnosis not present

## 2018-08-30 DIAGNOSIS — E1169 Type 2 diabetes mellitus with other specified complication: Secondary | ICD-10-CM

## 2018-08-30 DIAGNOSIS — E1159 Type 2 diabetes mellitus with other circulatory complications: Secondary | ICD-10-CM | POA: Diagnosis not present

## 2018-08-30 DIAGNOSIS — I10 Essential (primary) hypertension: Secondary | ICD-10-CM

## 2018-08-30 DIAGNOSIS — E785 Hyperlipidemia, unspecified: Secondary | ICD-10-CM | POA: Diagnosis not present

## 2018-08-30 MED ORDER — LISINOPRIL 20 MG PO TABS: 20.0000 mg | ORAL_TABLET | Freq: Every day | ORAL | 0 refills | Status: DC

## 2018-08-30 MED ORDER — CLINDAMYCIN PHOSPHATE 1 % EX SOLN: Freq: Two times a day (BID) | CUTANEOUS | 0 refills | Status: DC

## 2018-08-30 MED ORDER — DOXYCYCLINE HYCLATE 100 MG PO TABS: 100.0000 mg | ORAL_TABLET | Freq: Two times a day (BID) | ORAL | 0 refills | Status: AC

## 2018-08-30 MED ORDER — SIMVASTATIN 40 MG PO TABS: 40.0000 mg | ORAL_TABLET | Freq: Every day | ORAL | 0 refills | Status: DC

## 2018-08-30 NOTE — Patient Instructions (Signed)
Hidradenitis Suppurativa  Hidradenitis suppurativa is a long-term (chronic) skin disease. It is similar to a severe form of acne, but it affects areas of the body where acne would be unusual, especially areas of the body where skin rubs against skin and becomes moist. These include:  · Underarms.  · Groin.  · Genital area.  · Buttocks.  · Upper thighs.  · Breasts.  Hidradenitis suppurativa may start out as small lumps or pimples caused by blocked sweat glands or hair follicles. Pimples may develop into deep sores that break open (rupture) and drain pus. Over time, affected areas of skin may thicken and become scarred. This condition is rare and does not spread from person to person (non-contagious).  What are the causes?  The exact cause of this condition is not known. It may be related to:  · Male and male hormones.  · An overactive disease-fighting system (immune system). The immune system may over-react to blocked hair follicles or sweat glands and cause swelling and pus-filled sores.  What increases the risk?  You are more likely to develop this condition if you:  · Are male.  · Are 11-55 years old.  · Have a family history of hidradenitis suppurativa.  · Have a personal history of acne.  · Are overweight.  · Smoke.  · Take the medicine lithium.  What are the signs or symptoms?  The first symptoms are usually painful bumps in the skin, similar to pimples. The condition may get worse over time (progress), or it may only cause mild symptoms. If the disease progresses, symptoms may include:  · Skin bumps getting bigger and growing deeper into the skin.  · Bumps rupturing and draining pus.  · Itchy, infected skin.  · Skin getting thicker and scarred.  · Tunnels under the skin (fistulas) where pus drains from a bump.  · Pain during daily activities, such as pain during walking if your groin area is affected.  · Emotional problems, such as stress or depression. This condition may affect your appearance and your  ability or willingness to wear certain clothes or do certain activities.  How is this diagnosed?  This condition is diagnosed by a health care provider who specializes in skin diseases (dermatologist). You may be diagnosed based on:  · Your symptoms and medical history.  · A physical exam.  · Testing a pus sample for infection.  · Blood tests.  How is this treated?  Your treatment will depend on how severe your symptoms are. The same treatment will not work for everybody with this condition. You may need to try several treatments to find what works best for you. Treatment may include:  · Cleaning and bandaging (dressing) your wounds as needed.  · Lifestyle changes, such as new skin care routines.  · Taking medicines, such as:  ? Antibiotics.  ? Acne medicines.  ? Medicines to reduce the activity of the immune system.  ? A diabetes medicine (metformin).  ? Birth control pills, for women.  ? Steroids to reduce swelling and pain.  · Working with a mental health care provider, if you experience emotional distress due to this condition.  If you have severe symptoms that do not get better with medicine, you may need surgery. Surgery may involve:  · Using a laser to clear the skin and remove hair follicles.  · Opening and draining deep sores.  · Removing the areas of skin that are diseased and scarred.  Follow these instructions at home:    Medicines    · Take over-the-counter and prescription medicines only as told by your health care provider.  · If you were prescribed an antibiotic medicine, take it as told by your health care provider. Do not stop taking the antibiotic even if your condition improves.  Skin care  · If you have open wounds, cover them with a clean dressing as told by your health care provider. Keep wounds clean by washing them gently with soap and water when you bathe.  · Do not shave the areas where you get hidradenitis suppurativa.  · Do not wear deodorant.  · Wear loose-fitting clothes.  · Try to avoid  getting overheated or sweaty. If you get sweaty or wet, change into clean, dry clothes as soon as you can.  · To help relieve pain and itchiness, cover sore areas with a warm, clean washcloth (warm compress) for 5-10 minutes as often as needed.  · If told by your health care provider, take a bleach bath twice a week:  ? Fill your bathtub halfway with water.  ? Pour in ½ cup of unscented household bleach.  ? Soak in the tub for 5-10 minutes.  ? Only soak from the neck down. Avoid water on your face and hair.  ? Shower to rinse off the bleach from your skin.  General instructions  · Learn as much as you can about your disease so that you have an active role in your treatment. Work closely with your health care provider to find treatments that work for you.  · If you are overweight, work with your health care provider to lose weight as recommended.  · Do not use any products that contain nicotine or tobacco, such as cigarettes and e-cigarettes. If you need help quitting, ask your health care provider.  · If you struggle with living with this condition, talk with your health care provider or work with a mental health care provider as recommended.  · Keep all follow-up visits as told by your health care provider. This is important.  Where to find more information  · Hidradenitis Suppurativa [employer] Inc.: https://www.hs-foundation.org/  Contact a health care provider if you have:  · A flare-up of hidradenitis suppurativa.  · A fever or chills.  · Trouble controlling your symptoms at home.  · Trouble doing your daily activities because of your symptoms.  · Trouble dealing with emotional problems related to your condition.  Summary  · Hidradenitis suppurativa is a long-term (chronic) skin disease. It is similar to a severe form of acne, but it affects areas of the body where acne would be unusual.  · The first symptoms are usually painful bumps in the skin, similar to pimples. The condition may get worse over time  (progress), or it may only cause mild symptoms.  · If you have open wounds, cover them with a clean dressing as told by your health care provider. Keep wounds clean by washing them gently with soap and water when you bathe.  · Besides skin care, treatment may include medicines, laser treatment, and surgery.  This information is not intended to replace advice given to you by your health care provider. Make sure you discuss any questions you have with your health care provider.  Document Released: 01/14/2004 Document Revised: 06/09/2017 Document Reviewed: 06/09/2017  Elsevier Interactive Patient Education © 2019 Elsevier Inc.

## 2018-08-30 NOTE — Progress Notes (Signed)
Patient: Jose Barton Male    DOB: 10-22-1970   48 y.o.   MRN: [id number] Visit Date: 08/30/2018  Today's Provider: Trey Sailors, PA-C   Chief Complaint  Patient presents with  . Cyst   Subjective:     HPI  Patient here today with c/o knot/cyst under arm,right side. Reports that is tender,raw. Some discharge last night after putting a warm rug for a few minutes. No fever. Reports he has had this before and saw a dermatologist who told him it was a clogged pore.   He has stopped taking his cholesterol and blood pressure medication. He says he wants to see if he can get by without it. He continues to smoke, however, and does not intend on quitting.   BP Readings from Last 3 Encounters:  08/30/18 (!) 142/93  06/17/18 (!) 143/91  03/25/18 (!) 160/100     Allergies  Allergen Reactions  . Codeine Hives  . Percocet [Oxycodone-Acetaminophen] Hives  . Vicodin [Hydrocodone-Acetaminophen] Hives  . Bupropion Swelling and Hives     Current Outpatient Medications:  .  escitalopram (LEXAPRO) 20 MG tablet, Take 1 tablet (20 mg total) by mouth daily. (Patient not taking: Reported on 08/30/2018), Disp: 90 tablet, Rfl: 0 .  lisinopril (PRINIVIL,ZESTRIL) 20 MG tablet, Take 1 tablet (20 mg total) by mouth daily. (Patient not taking: Reported on 08/30/2018), Disp: 90 tablet, Rfl: 0 .  simvastatin (ZOCOR) 40 MG tablet, Take 1 tablet (40 mg total) by mouth at bedtime. (Patient not taking: Reported on 08/30/2018), Disp: 90 tablet, Rfl: 0  Review of Systems  Social History   Tobacco Use  . Smoking status: Current Every Day Smoker    Packs/day: 0.50    Years: 30.00    Pack years: 15.00    Types: Cigarettes  . Smokeless tobacco: Never Used  Substance Use Topics  . Alcohol use: Yes    Alcohol/week: 24.0 standard drinks    Types: 24 Cans of beer per week    Comment: daily      Objective:   BP (!) 142/93 (BP Location: Left Arm, Patient Position: Sitting, Cuff Size: Large)    Pulse 98   Temp 98.6 F (37 C) (Oral)   Resp 16   Wt 180 lb 6.4 oz (81.8 kg)   BMI 27.43 kg/m  Vitals:   08/30/18 1042  BP: (!) 142/93  Pulse: 98  Resp: 16  Temp: 98.6 F (37 C)  TempSrc: Oral  Weight: 180 lb 6.4 oz (81.8 kg)     Physical Exam Constitutional:      Appearance: Normal appearance.  Cardiovascular:     Rate and Rhythm: Normal rate and regular rhythm.     Heart sounds: Normal heart sounds.  Pulmonary:     Effort: Pulmonary effort is normal.     Breath sounds: Normal breath sounds.  Skin:    General: Skin is warm and dry.       Neurological:     Mental Status: He is alert and oriented to person, place, and time. Mental status is at baseline.  Psychiatric:        Mood and Affect: Mood normal.        Behavior: Behavior normal.         Assessment & Plan    1. Hidradenitis suppurativa  Will treat with doxycycline for acute flare. Clindamycin wash for future milder flare ups. Patient may need to see dermatologist in future, can refer if he  wishes.   - doxycycline (VIBRA-TABS) 100 MG tablet; Take 1 tablet (100 mg total) by mouth 2 (two) times daily for 7 days.  Dispense: 14 tablet; Refill: 0 - clindamycin (CLEOCIN-T) 1 % external solution; Apply topically 2 (two) times daily.  Dispense: 30 mL; Refill: 0  2. Hyperlipidemia associated with type 2 diabetes mellitus (HCC)  Needs to take his medication. He has been advised that he needs this medication, his blood pressure is uncontrolled without it. Moreover, if he would like to stop something in efforts to lower his BP, he can stop smoking which he is not interested in doing.   - simvastatin (ZOCOR) 40 MG tablet; Take 1 tablet (40 mg total) by mouth at bedtime.  Dispense: 90 tablet; Refill: 0  3. Hypertension associated with diabetes (HCC)  - lisinopril (PRINIVIL,ZESTRIL) 20 MG tablet; Take 1 tablet (20 mg total) by mouth daily.  Dispense: 90 tablet; Refill: 0  The entirety of the information documented in  the History of Present Illness, Review of Systems and Physical Exam were personally obtained by me. Portions of this information were initially documented by Hetty Ely, CMA and reviewed by me for thoroughness and accuracy.   F/u PRN       Trey Sailors, PA-C  [facility]

## 2018-08-31 ENCOUNTER — Ambulatory Visit: Payer: No Typology Code available for payment source | Admitting: Physician Assistant

## 2018-09-08 NOTE — Telephone Encounter (Signed)
Error

## 2018-09-16 ENCOUNTER — Ambulatory Visit: Payer: No Typology Code available for payment source | Admitting: Physician Assistant

## 2018-09-29 ENCOUNTER — Telehealth: Payer: Self-pay | Admitting: Physician Assistant

## 2018-09-29 DIAGNOSIS — F419 Anxiety disorder, unspecified: Secondary | ICD-10-CM

## 2018-09-29 MED ORDER — ESCITALOPRAM OXALATE 20 MG PO TABS: ORAL_TABLET | ORAL | 0 refills | Status: DC

## 2018-09-29 MED ORDER — BUSPIRONE HCL 5 MG PO TABS: 5.0000 mg | ORAL_TABLET | Freq: Two times a day (BID) | ORAL | 0 refills | Status: AC

## 2018-09-29 NOTE — Telephone Encounter (Signed)
Sent!

## 2018-09-29 NOTE — Telephone Encounter (Signed)
Patient advised.

## 2018-09-29 NOTE — Telephone Encounter (Signed)
Pt called saying he has changed his mind and would like to get a refill on the Lexapro and the Buspar.  He said he talked to you about this the last time he was in  CB#  [phone]  [facility] [address] [place]  Thanks teri

## 2019-04-07 ENCOUNTER — Encounter: Payer: Self-pay | Admitting: Physician Assistant

## 2019-04-07 ENCOUNTER — Ambulatory Visit: Payer: No Typology Code available for payment source | Admitting: Physician Assistant

## 2019-04-07 ENCOUNTER — Other Ambulatory Visit: Payer: Self-pay

## 2019-04-07 VITALS — BP 151/110 | HR 91 | Temp 97.7°F | Resp 16 | Ht 68.0 in | Wt 183.0 lb

## 2019-04-07 DIAGNOSIS — E1159 Type 2 diabetes mellitus with other circulatory complications: Secondary | ICD-10-CM | POA: Diagnosis not present

## 2019-04-07 DIAGNOSIS — I10 Essential (primary) hypertension: Secondary | ICD-10-CM

## 2019-04-07 DIAGNOSIS — S83209S Unspecified tear of unspecified meniscus, current injury, unspecified knee, sequela: Secondary | ICD-10-CM | POA: Diagnosis not present

## 2019-04-07 DIAGNOSIS — E119 Type 2 diabetes mellitus without complications: Secondary | ICD-10-CM

## 2019-04-07 DIAGNOSIS — E1169 Type 2 diabetes mellitus with other specified complication: Secondary | ICD-10-CM

## 2019-04-07 DIAGNOSIS — E785 Hyperlipidemia, unspecified: Secondary | ICD-10-CM

## 2019-04-07 MED ORDER — SIMVASTATIN 40 MG PO TABS: 40.0000 mg | ORAL_TABLET | Freq: Every day | ORAL | 0 refills | Status: DC

## 2019-04-07 MED ORDER — MELOXICAM 7.5 MG PO TABS: 7.5000 mg | ORAL_TABLET | Freq: Every day | ORAL | 0 refills | Status: DC

## 2019-04-07 MED ORDER — LISINOPRIL 20 MG PO TABS: 20.0000 mg | ORAL_TABLET | Freq: Every day | ORAL | 1 refills | Status: DC

## 2019-04-07 NOTE — Patient Instructions (Addendum)
Take meloxicam daily. Take with 20 mg pepcid. Use food.  Schedule your eye exam.   Diabetes Mellitus and Exercise Exercising regularly is important for your overall health, especially when you have diabetes (diabetes mellitus). Exercising is not only about losing weight. It has many other health benefits, such as increasing muscle strength and bone density and reducing body fat and stress. This leads to improved fitness, flexibility, and endurance, all of which result in better overall health. Exercise has additional benefits for people with diabetes, including:  Reducing appetite.  Helping to lower and control blood glucose.  Lowering blood pressure.  Helping to control amounts of fatty substances (lipids) in the blood, such as cholesterol and triglycerides.  Helping the body to respond better to insulin (improving insulin sensitivity).  Reducing how much insulin the body needs.  Decreasing the risk for heart disease by: ? Lowering cholesterol and triglyceride levels. ? Increasing the levels of good cholesterol. ? Lowering blood glucose levels. What is my activity plan? Your health care provider or certified diabetes educator can help you make a plan for the type and frequency of exercise (activity plan) that works for you. Make sure that you:  Do at least 150 minutes of moderate-intensity or vigorous-intensity exercise each week. This could be brisk walking, biking, or water aerobics. ? Do stretching and strength exercises, such as yoga or weightlifting, at least 2 times a week. ? Spread out your activity over at least 3 days of the week.  Get some form of physical activity every day. ? Do not go more than 2 days in a row without some kind of physical activity. ? Avoid being inactive for more than 30 minutes at a time. Take frequent breaks to walk or stretch.  Choose a type of exercise or activity that you enjoy, and set realistic goals.  Start slowly, and gradually increase the  intensity of your exercise over time. What do I need to know about managing my diabetes?   Check your blood glucose before and after exercising. ? If your blood glucose is 240 mg/dL (26.7 mmol/L) or higher before you exercise, check your urine for ketones. If you have ketones in your urine, do not exercise until your blood glucose returns to normal. ? If your blood glucose is 100 mg/dL (5.6 mmol/L) or lower, eat a snack containing 15-20 grams of carbohydrate. Check your blood glucose 15 minutes after the snack to make sure that your level is above 100 mg/dL (5.6 mmol/L) before you start your exercise.  Know the symptoms of low blood glucose (hypoglycemia) and how to treat it. Your risk for hypoglycemia increases during and after exercise. Common symptoms of hypoglycemia can include: ? Hunger. ? Anxiety. ? Sweating and feeling clammy. ? Confusion. ? Dizziness or feeling light-headed. ? Increased heart rate or palpitations. ? Blurry vision. ? Tingling or numbness around the mouth, lips, or tongue. ? Tremors or shakes. ? Irritability.  Keep a rapid-acting carbohydrate snack available before, during, and after exercise to help prevent or treat hypoglycemia.  Avoid injecting insulin into areas of the body that are going to be exercised. For example, avoid injecting insulin into: ? The arms, when playing tennis. ? The legs, when jogging.  Keep records of your exercise habits. Doing this can help you and your health care provider adjust your diabetes management plan as needed. Write down: ? Food that you eat before and after you exercise. ? Blood glucose levels before and after you exercise. ? The type and  amount of exercise you have done. ? When your insulin is expected to peak, if you use insulin. Avoid exercising at times when your insulin is peaking.  When you start a new exercise or activity, work with your health care provider to make sure the activity is safe for you, and to adjust  your insulin, medicines, or food intake as needed.  Drink plenty of water while you exercise to prevent dehydration or heat stroke. Drink enough fluid to keep your urine clear or pale yellow. Summary  Exercising regularly is important for your overall health, especially when you have diabetes (diabetes mellitus).  Exercising has many health benefits, such as increasing muscle strength and bone density and reducing body fat and stress.  Your health care provider or certified diabetes educator can help you make a plan for the type and frequency of exercise (activity plan) that works for you.  When you start a new exercise or activity, work with your health care provider to make sure the activity is safe for you, and to adjust your insulin, medicines, or food intake as needed. This information is not intended to replace advice given to you by your health care provider. Make sure you discuss any questions you have with your health care provider. Document Released: 08/22/2003 Document Revised: 12/24/2016 Document Reviewed: 11/11/2015 Elsevier Patient Education  2020 [employer].

## 2019-04-07 NOTE — Progress Notes (Signed)
Patient: Jose Barton Male    DOB: June 08, 1971   48 y.o.   MRN: [id number] Visit Date: 04/07/2019  Today's Mikeila Burgen: Trey Sailors, PA-C   Chief Complaint  Patient presents with  . Knee Pain   Subjective:     HPI   DM II: This has been historically diet controlled. Eye exam overdue since 07/2018. He reports he ran out of his Lisinopril and then there were no more refills at the pharmacy. He did not call the clinic for refills. Same for his simvastatin 40 mg.   Lab Results  Component Value Date   HGBA1C 5.9 (H) 06/17/2018   HTN: has not taken blood pressure medicine in months. Typically on Lisinopril 20 mg and has been resistant to taking this or increasing medication.   BP Readings from Last 3 Encounters:  04/07/19 (!) 151/110  08/30/18 (!) 142/93  06/17/18 (!) 143/91   HLD: Patient does not consistently take simvastatin 40 mg QHS.   Lipid Panel     Component Value Date/Time   CHOL 190 06/17/2018 1049   TRIG 134 06/17/2018 1049   HDL 46 06/17/2018 1049   CHOLHDL 4.1 06/17/2018 1049   LDLCALC 117 (H) 06/17/2018 1049   LABVLDL 27 06/17/2018 1049     Patient here today c/o knee pain. Reports he has a history of bilateral torn meniscus x 2 year. He has seen orthopedist Dr. Salvatore Marvel 2 years ago and underwent MRIs demonstrating this. He reports the other day he was crawling around and injured his left knee which is now painful to the touch and hurts to put pressure on.   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  Allergies  Allergen Reactions  . Codeine Hives  . Percocet [Oxycodone-Acetaminophen] Hives  . Vicodin [Hydrocodone-Acetaminophen] Hives  . Bupropion Swelling and Hives     Current Outpatient Medications:  .  clindamycin (CLEOCIN-T) 1 % external solution, Apply topically 2 (two) times daily., Disp: 30 mL, Rfl: 0 .  escitalopram (LEXAPRO) 20 MG tablet, Take 0.5 tablets (10 mg total) by mouth daily for 7 days,  THEN 1 tablet (20 mg total) daily., Disp: 86.5 tablet, Rfl: 0 .  simvastatin (ZOCOR) 40 MG tablet, Take 1 tablet (40 mg total) by mouth at bedtime., Disp: 90 tablet, Rfl: 0 .  lisinopril (ZESTRIL) 20 MG tablet, Take 1 tablet (20 mg total) by mouth daily., Disp: 90 tablet, Rfl: 1 .  meloxicam (MOBIC) 7.5 MG tablet, Take 1 tablet (7.5 mg total) by mouth daily., Disp: 30 tablet, Rfl: 0  Review of Systems  Social History   Tobacco Use  . Smoking status: Current Every Day Smoker    Packs/day: 0.50    Years: 30.00    Pack years: 15.00    Types: Cigarettes  . Smokeless tobacco: Never Used  Substance Use Topics  . Alcohol use: Yes    Alcohol/week: 24.0 standard drinks    Types: 24 Cans of beer per week    Comment: daily      Objective:   BP (!) 151/110 (BP Location: Left Arm, Patient Position: Sitting, Cuff Size: Normal)   Pulse 91   Temp 97.7 F (36.5 C) (Temporal)   Resp 16   Ht 5\' 8"  (1.727 m)   Wt 183 lb (83 kg)   BMI 27.83 kg/m  Vitals:   04/07/19 0820  BP: (!) 151/110  Pulse: 91  Resp: 16  Temp: 97.7  F (36.5 C)  TempSrc: Temporal  Weight: 183 lb (83 kg)  Height: 5\' 8"  (1.727 m)  Body mass index is 27.83 kg/m.   Physical Exam Constitutional:      Appearance: Normal appearance.  Cardiovascular:     Rate and Rhythm: Normal rate and regular rhythm.     Heart sounds: Normal heart sounds.  Pulmonary:     Effort: Pulmonary effort is normal.     Breath sounds: Normal breath sounds.  Musculoskeletal:     Left knee: He exhibits decreased range of motion. Tenderness found.  Skin:    General: Skin is warm and dry.  Neurological:     Mental Status: He is alert and oriented to person, place, and time. Mental status is at baseline.  Psychiatric:        Mood and Affect: Mood normal.        Behavior: Behavior normal.      No results found for any visits on 04/07/19.     Assessment & Plan    1. Hypertension associated with diabetes ([place])  Not taking. Counseled  he must take this as his BP is uncontrolled.   - lisinopril (ZESTRIL) 20 MG tablet; Take 1 tablet (20 mg total) by mouth daily.  Dispense: 90 tablet; Refill: 1 - Comprehensive Metabolic Panel (CMET) - Lipid Profile - HgB A1c  2. Controlled type 2 diabetes mellitus without complication, without long-term current use of insulin (HCC)  A1c today. Needs to schedule eye exam with [facility].   3. Hyperlipidemia associated with type 2 diabetes mellitus ([place])  Needs to take this consistently.   - simvastatin (ZOCOR) 40 MG tablet; Take 1 tablet (40 mg total) by mouth at bedtime.  Dispense: 90 tablet; Refill: 0  4. Tear of meniscus of knee, unspecified laterality, unspecified meniscus, unspecified tear type, unspecified whether old or current tear, sequela  Will give anti-inflammatory. He should take this with some food and also pepcid 20 mg daily.   - Ambulatory referral to Orthopedic Surgery - meloxicam (MOBIC) 7.5 MG tablet; Take 1 tablet (7.5 mg total) by mouth daily.  Dispense: 30 tablet; Refill: [place], PA-C  [facility]

## 2019-04-08 LAB — COMPREHENSIVE METABOLIC PANEL
ALT: 25 IU/L (ref 0–44)
AST: 20 IU/L (ref 0–40)
Albumin/Globulin Ratio: 2.1 (ref 1.2–2.2)
Albumin: 4.7 g/dL (ref 4.0–5.0)
Alkaline Phosphatase: 93 IU/L (ref 39–117)
BUN/Creatinine Ratio: 23 — ABNORMAL HIGH (ref 9–20)
BUN: 20 mg/dL (ref 6–24)
Bilirubin Total: 0.2 mg/dL (ref 0.0–1.2)
CO2: 21 mmol/L (ref 20–29)
Calcium: 9.5 mg/dL (ref 8.7–10.2)
Chloride: 104 mmol/L (ref 96–106)
Creatinine, Ser: 0.86 mg/dL (ref 0.76–1.27)
GFR calc Af Amer: 119 mL/min/{1.73_m2} (ref >59)
GFR calc non Af Amer: 103 mL/min/{1.73_m2} (ref >59)
Globulin, Total: 2.2 g/dL (ref 1.5–4.5)
Glucose: 126 mg/dL — ABNORMAL HIGH (ref 65–99)
Potassium: 4.6 mmol/L (ref 3.5–5.2)
Sodium: 139 mmol/L (ref 134–144)
Total Protein: 6.9 g/dL (ref 6.0–8.5)

## 2019-04-08 LAB — HEMOGLOBIN A1C
Est. average glucose Bld gHb Est-mCnc: 126 mg/dL
Hgb A1c MFr Bld: 6 % — ABNORMAL HIGH (ref 4.8–5.6)

## 2019-04-08 LAB — LIPID PANEL
Chol/HDL Ratio: 5.2 ratio — ABNORMAL HIGH (ref 0.0–5.0)
Cholesterol, Total: 257 mg/dL — ABNORMAL HIGH (ref 100–199)
HDL: 49 mg/dL (ref >39)
LDL Chol Calc (NIH): 173 mg/dL — ABNORMAL HIGH (ref 0–99)
Triglycerides: 187 mg/dL — ABNORMAL HIGH (ref 0–149)
VLDL Cholesterol Cal: 35 mg/dL (ref 5–40)

## 2019-04-10 ENCOUNTER — Other Ambulatory Visit: Payer: Self-pay

## 2019-04-10 DIAGNOSIS — F419 Anxiety disorder, unspecified: Secondary | ICD-10-CM

## 2019-04-10 MED ORDER — ESCITALOPRAM OXALATE 20 MG PO TABS: ORAL_TABLET | ORAL | 0 refills | Status: DC

## 2019-04-10 MED ORDER — BUSPIRONE HCL 5 MG PO TABS: 5.0000 mg | ORAL_TABLET | Freq: Two times a day (BID) | ORAL | 0 refills | Status: DC

## 2019-04-10 NOTE — Telephone Encounter (Signed)
-----   Message from Trinna Post, [place] sent at 04/10/2019  2:39 PM EDT ----- A1c is 6 which is well controlled. Cholesterol uncontrolled and needs to start his cholesterol medication again.

## 2019-04-10 NOTE — Telephone Encounter (Signed)
Patient was advised and states that he have started his Simvastatin 40 MG back up. Patient Buspar and Lexapro was not send into the pharmacy and want to know if you was going to still send the medications in, please advise.

## 2019-04-10 NOTE — Telephone Encounter (Signed)
Sent!

## 2019-04-14 ENCOUNTER — Other Ambulatory Visit: Payer: Self-pay

## 2019-04-14 DIAGNOSIS — Z20822 Contact with and (suspected) exposure to covid-19: Secondary | ICD-10-CM

## 2019-04-16 ENCOUNTER — Encounter: Payer: Self-pay | Admitting: Physician Assistant

## 2019-04-17 LAB — NOVEL CORONAVIRUS, NAA: SARS-CoV-2, NAA: NOT DETECTED

## 2019-04-30 ENCOUNTER — Other Ambulatory Visit: Payer: Self-pay | Admitting: Physician Assistant

## 2019-04-30 DIAGNOSIS — S83209S Unspecified tear of unspecified meniscus, current injury, unspecified knee, sequela: Secondary | ICD-10-CM

## 2019-06-28 ENCOUNTER — Other Ambulatory Visit: Payer: Self-pay | Admitting: Physician Assistant

## 2019-06-28 DIAGNOSIS — E1169 Type 2 diabetes mellitus with other specified complication: Secondary | ICD-10-CM

## 2019-07-03 DIAGNOSIS — Z20822 Contact with and (suspected) exposure to covid-19: Secondary | ICD-10-CM | POA: Diagnosis not present

## 2019-07-03 DIAGNOSIS — R05 Cough: Secondary | ICD-10-CM | POA: Diagnosis not present

## 2019-07-12 ENCOUNTER — Other Ambulatory Visit: Payer: Self-pay | Admitting: Physician Assistant

## 2019-07-12 DIAGNOSIS — F419 Anxiety disorder, unspecified: Secondary | ICD-10-CM

## 2019-07-12 NOTE — Telephone Encounter (Signed)
Requested medication (s) are due for refill today: Escitalopram, yes  Requested medication (s) are on the active medication list: yes  Last refill:  04/10/2019  Future visit scheduled: yes  Notes to clinic:  please clarify dose  Requested medication (s) are due for refill today: Buspirone, yes  Requested medication (s) are on the active medication list: no  Last refill:  04/10/2019  Future visit scheduled: no  Notes to clinic:  not on active med list  Requested Prescriptions  Pending Prescriptions Disp Refills   escitalopram (LEXAPRO) 20 MG tablet [Pharmacy Med Name: ESCITALOPRAM 20 MG TABLET] 87 tablet     Sig: TAKE 1/2 TABLET BY MOUTH EVERY DAY FOR 7 DAYS THEN TAKE 1 TABLET DAILY      Psychiatry:  Antidepressants - SSRI Passed - 07/12/2019 10:31 AM      Passed - Valid encounter within last 6 months    Recent Outpatient Visits           3 months ago Hypertension associated with diabetes [facility])   [facility] [place], Adriana M, PA-C   10 months ago Hidradenitis suppurativa   [facility] [place], Lavella [place], [place]   1 year ago Annual physical exam   [facility] Osvaldo Angst M, [place]   1 year ago Viral gastroenteritis   [facility] [place], Ricki Rodriguez M, [place]   1 year ago Controlled type 2 diabetes mellitus without complication, without long-term current use of insulin [facility])   [facility] [place], Adriana M, PA-C                busPIRone (BUSPAR) 5 MG tablet [Pharmacy Med Name: BUSPIRONE HCL 5 MG TABLET] 180 tablet 0    Sig: TAKE 1 TABLET BY MOUTH TWICE A DAY      Psychiatry: Anxiolytics/Hypnotics - Non-controlled Passed - 07/12/2019 10:31 AM      Passed - Valid encounter within last 6 months    Recent Outpatient Visits           3 months ago Hypertension associated with diabetes [facility])   [facility] [place], Ricki Rodriguez M, PA-C   10 months ago Hidradenitis suppurativa   [facility] [place], Lavella [place], [place]   1 year ago Annual physical exam   [facility] Osvaldo Angst M, [place]   1 year ago Viral gastroenteritis   [facility] [place], [place], [place]   1 year ago Controlled type 2 diabetes mellitus without complication, without long-term current use of insulin [facility])   [facility] [place], [place], [place]

## 2019-07-31 DIAGNOSIS — Z20828 Contact with and (suspected) exposure to other viral communicable diseases: Secondary | ICD-10-CM | POA: Diagnosis not present

## 2019-07-31 DIAGNOSIS — Z03818 Encounter for observation for suspected exposure to other biological agents ruled out: Secondary | ICD-10-CM | POA: Diagnosis not present

## 2019-09-26 ENCOUNTER — Ambulatory Visit: Payer: BC Managed Care – PPO | Admitting: Physician Assistant

## 2019-09-26 ENCOUNTER — Other Ambulatory Visit: Payer: Self-pay

## 2019-09-26 ENCOUNTER — Encounter: Payer: Self-pay | Admitting: Physician Assistant

## 2019-09-26 VITALS — BP 128/82 | HR 79 | Temp 97.5°F | Wt 183.2 lb

## 2019-09-26 DIAGNOSIS — I10 Essential (primary) hypertension: Secondary | ICD-10-CM

## 2019-09-26 DIAGNOSIS — E785 Hyperlipidemia, unspecified: Secondary | ICD-10-CM | POA: Diagnosis not present

## 2019-09-26 DIAGNOSIS — E1159 Type 2 diabetes mellitus with other circulatory complications: Secondary | ICD-10-CM | POA: Diagnosis not present

## 2019-09-26 DIAGNOSIS — F419 Anxiety disorder, unspecified: Secondary | ICD-10-CM | POA: Diagnosis not present

## 2019-09-26 DIAGNOSIS — E1169 Type 2 diabetes mellitus with other specified complication: Secondary | ICD-10-CM | POA: Diagnosis not present

## 2019-09-26 DIAGNOSIS — I152 Hypertension secondary to endocrine disorders: Secondary | ICD-10-CM

## 2019-09-26 DIAGNOSIS — E119 Type 2 diabetes mellitus without complications: Secondary | ICD-10-CM

## 2019-09-26 LAB — POCT GLYCOSYLATED HEMOGLOBIN (HGB A1C): Hemoglobin A1C: 6.1 % — AB (ref 4.0–5.6)

## 2019-09-26 MED ORDER — HYDROXYZINE HCL 10 MG PO TABS: 10.0000 mg | ORAL_TABLET | Freq: Three times a day (TID) | ORAL | 0 refills | Status: DC | PRN

## 2019-09-26 NOTE — Progress Notes (Signed)
Established patient visit      Patient: Jose Barton   DOB: 05-Dec-1970   49 y.o. Male  MRN: [id number] Visit Date: 09/26/2019  Today's healthcare provider: Trinna Post, PA-C  Subjective:    Chief Complaint  Patient presents with  . Hypertension  . Hyperlipidemia  . Diabetes  I,Gerritt Galentine C Prudie Guthridge,acting as a scribe for Trinna Post, PA-C.,have documented all relevant documentation on the behalf of Trinna Post, PA-C,as directed by  Trinna Post, PA-C while in the presence of Adriana M Pollak, PA-C. HPI Diabetes Mellitus Type II, Follow-up  Lab Results  Component Value Date   HGBA1C 6.0 (H) 04/07/2019   HGBA1C 5.9 (H) 06/17/2018   HGBA1C 6.1 (A) 03/17/2018   Last seen for diabetes 6 months ago.  Management since then includes continuing the same treatment. He reports good compliance with treatment. He is not having side effects.   Home blood sugar records: not checking  Episodes of hypoglycemia? No    Current insulin regiment: None Most Recent Eye Exam: not   ---------------------------------------------------------------------------------------------------------------------- Hypertension, follow-up  BP Readings from Last 3 Encounters:  09/26/19 128/82  04/07/19 (!) 151/110  08/30/18 (!) 142/93   He was last seen for hypertension 6 months ago.  BP at that visit was 151/10. Management since that visit includes taking Lisinopril 20 mg daily. He reports good compliance with treatment. He is not having side effects.  He is exercising. He is adherent to low salt diet.   Outside blood pressures are not checking.  He does smoke.  Use of agents associated with hypertension: none.   ---------------------------------------------------------------------------------------------------------------------- Lipid/Cholesterol, Follow-up  Last Lipid Panel:    Component Value Date/Time   CHOL 257 (H) 04/07/2019 0841   TRIG 187 (H) 04/07/2019 0841   HDL 49 04/07/2019 0841   CHOLHDL 5.2 (H) 04/07/2019 0841   LDLCALC 173 (H) 04/07/2019 0841    He was last seen for this 6 months ago.  Management since that visit includes starting simvastatin 40 MG.  He reports good compliance with treatment. He is not having side effects.  Symptoms: No appetite changes No foot ulcerations No chest pain No chest pressure/discomfort No dyspnea No fatigue No lower extremity edema No nausea No numbness or tingling of extremity No orthopnea No palpitations No paroxysmal nocturnal dyspnea No polydipsia No polyuria No speech difficulty No syncope No visual disturbances   He is following a Regular, Low Sodium diet. Current exercise: none  Wt Readings from Last 3 Encounters:  09/26/19 183 lb 3.2 oz (83.1 kg)  04/07/19 183 lb (83 kg)  08/30/18 180 lb 6.4 oz (81.8 kg)   Last metabolic panel Lab Results  Component Value Date   GLUCOSE 126 (H) 04/07/2019   NA 139 04/07/2019   K 4.6 04/07/2019   CL 104 04/07/2019   CO2 21 04/07/2019   BUN 20 04/07/2019   CREATININE 0.86 04/07/2019   GFRNONAA 103 04/07/2019   GFRAA 119 04/07/2019   CALCIUM 9.5 04/07/2019   PROT 6.9 04/07/2019   ALBUMIN 4.7 04/07/2019   LABGLOB 2.2 04/07/2019   AGRATIO 2.1 04/07/2019   BILITOT 0.2 04/07/2019   ALKPHOS 93 04/07/2019   AST 20 04/07/2019   ALT 25 04/07/2019   ANIONGAP 5 04/03/2017   The 10-year ASCVD risk score Mikey Bussing DC Jr., et al., 2013) is: 21.1%  -------------------------------------------------------------------------------------------------------------       Medications: Outpatient Medications Prior to Visit  Medication Sig  . busPIRone (BUSPAR) 5 MG tablet TAKE  1 TABLET BY MOUTH TWICE A DAY  . clindamycin (CLEOCIN-T) 1 % external solution Apply topically 2 (two) times daily.  Marland Kitchen escitalopram (LEXAPRO) 20 MG tablet Take 1 tablet (20 mg total) by mouth daily.  Marland Kitchen lisinopril (ZESTRIL) 20 MG tablet Take 1 tablet (20 mg total) by mouth daily.   . meloxicam (MOBIC) 7.5 MG tablet TAKE 1 TABLET BY MOUTH EVERY DAY  . simvastatin (ZOCOR) 40 MG tablet TAKE 1 TABLET BY MOUTH EVERYDAY AT BEDTIME   No facility-administered medications prior to visit.    Review of Systems  Constitutional: Negative.   Cardiovascular: Negative.   Hematological: Negative.         Objective:    BP 128/82 (BP Location: Right Arm, Patient Position: Sitting, Cuff Size: Normal)   Pulse 79   Temp (!) 97.5 F (36.4 C) (Temporal)   Wt 183 lb 3.2 oz (83.1 kg)   SpO2 96%   BMI 27.86 kg/m    Physical Exam Constitutional:      Appearance: Normal appearance.  Cardiovascular:     Rate and Rhythm: Normal rate and regular rhythm.  Pulmonary:     Effort: Pulmonary effort is normal.     Breath sounds: Normal breath sounds.  Skin:    General: Skin is warm and dry.  Neurological:     General: No focal deficit present.     Mental Status: He is alert and oriented to person, place, and time.  Psychiatric:        Mood and Affect: Mood normal.        Behavior: Behavior normal.       No results found for any visits on 09/26/19.    Assessment & Plan:    1. Controlled type 2 diabetes mellitus without complication, without long-term current use of insulin (HCC) Well controlled with last A1c 6.1 Continue current medications UTD on vaccines, eye exam scheduled, foot exam On ACEi On Statin Discussed diet and exercise F/u in 6 months for chronic and CPE - POCT glycosylated hemoglobin (Hb A1C) - Ambulatory referral to Ophthalmology  2. Anxiety Discussed counseling with patient and patient declined. - hydrOXYzine (ATARAX/VISTARIL) 10 MG tablet; Take 1 tablet (10 mg total) by mouth 3 (three) times daily as needed.  Dispense: 30 tablet; Refill: 0  3. Hyperlipidemia associated with type 2 diabetes mellitus (HCC) Previously uncontrolled on last lab. Patient was not taking medication and is now currently compliant with treatment. Continue statin Repeat FLP  and CMP Goal LDL < 70 - Comprehensive metabolic panel - Lipid panel  4. Hypertension associated with diabetes (HCC) Well controlled now. Continue current medications Recheck metabolic panel F/u in 6 months      Maryella Shivers  [facility] [phone] (phone) [phone] (fax)  [facility]

## 2019-09-27 ENCOUNTER — Telehealth: Payer: Self-pay

## 2019-09-27 DIAGNOSIS — E1169 Type 2 diabetes mellitus with other specified complication: Secondary | ICD-10-CM

## 2019-09-27 DIAGNOSIS — E785 Hyperlipidemia, unspecified: Secondary | ICD-10-CM

## 2019-09-27 LAB — LIPID PANEL
Chol/HDL Ratio: 3.3 ratio (ref 0.0–5.0)
Cholesterol, Total: 173 mg/dL (ref 100–199)
HDL: 53 mg/dL (ref >39)
LDL Chol Calc (NIH): 107 mg/dL — ABNORMAL HIGH (ref 0–99)
Triglycerides: 67 mg/dL (ref 0–149)
VLDL Cholesterol Cal: 13 mg/dL (ref 5–40)

## 2019-09-27 LAB — COMPREHENSIVE METABOLIC PANEL
ALT: 24 IU/L (ref 0–44)
AST: 24 IU/L (ref 0–40)
Albumin/Globulin Ratio: 2.5 — ABNORMAL HIGH (ref 1.2–2.2)
Albumin: 4.8 g/dL (ref 4.0–5.0)
Alkaline Phosphatase: 92 IU/L (ref 39–117)
BUN/Creatinine Ratio: 16 (ref 9–20)
BUN: 13 mg/dL (ref 6–24)
Bilirubin Total: <0.2 mg/dL (ref 0.0–1.2)
CO2: 21 mmol/L (ref 20–29)
Calcium: 9.8 mg/dL (ref 8.7–10.2)
Chloride: 106 mmol/L (ref 96–106)
Creatinine, Ser: 0.79 mg/dL (ref 0.76–1.27)
GFR calc Af Amer: 123 mL/min/{1.73_m2} (ref >59)
GFR calc non Af Amer: 106 mL/min/{1.73_m2} (ref >59)
Globulin, Total: 1.9 g/dL (ref 1.5–4.5)
Glucose: 111 mg/dL — ABNORMAL HIGH (ref 65–99)
Potassium: 4.7 mmol/L (ref 3.5–5.2)
Sodium: 142 mmol/L (ref 134–144)
Total Protein: 6.7 g/dL (ref 6.0–8.5)

## 2019-09-27 MED ORDER — SIMVASTATIN 80 MG PO TABS: ORAL_TABLET | ORAL | 1 refills | Status: DC

## 2019-09-27 NOTE — Telephone Encounter (Signed)
Patient was advised and states that he has been taking the simvastatin regularly. Also he states if he forgets he will double up on the simvastatin. Patient agreed with increasing the Simvastatin to 80 mg and mediation was send into pharmacy. FYI

## 2019-09-27 NOTE — Telephone Encounter (Signed)
-----   Message from Trey Sailors, [place] sent at 09/27/2019  9:20 AM EDT ----- Cholesterol improved now that he is taking medicine but still above goal of LDL < 70. If he has been taking the simvastatin 40 mg QHS for at least three months, would recommend increasing to simvastatin 80 mg QHS and you can send in #90 with one refill. We can recheck at next visit. Otherwise labs normal and sugar very well controlled.

## 2019-10-06 ENCOUNTER — Telehealth: Payer: Self-pay | Admitting: Physician Assistant

## 2019-10-06 NOTE — Telephone Encounter (Signed)
Per Jose Barton is not appropriate at this time. KW

## 2019-10-06 NOTE — Telephone Encounter (Signed)
Patient is calling to state that he is unable to take hydrOXYzine (ATARAX/VISTARIL) 10 MG tablet [[id number]] . It is bringing him down. It is not helping the anxiety feeling. And it is making him tired. Patient is wanting a low dose xanax- to take "the edge off." Please advise CB- [phone]  [facility] [address] [facility] [place], [place]

## 2019-10-23 ENCOUNTER — Other Ambulatory Visit: Payer: Self-pay | Admitting: Physician Assistant

## 2019-10-23 DIAGNOSIS — E1169 Type 2 diabetes mellitus with other specified complication: Secondary | ICD-10-CM

## 2019-10-23 DIAGNOSIS — E785 Hyperlipidemia, unspecified: Secondary | ICD-10-CM

## 2019-10-23 DIAGNOSIS — I152 Hypertension secondary to endocrine disorders: Secondary | ICD-10-CM

## 2019-10-23 DIAGNOSIS — F419 Anxiety disorder, unspecified: Secondary | ICD-10-CM

## 2019-10-23 DIAGNOSIS — E1159 Type 2 diabetes mellitus with other circulatory complications: Secondary | ICD-10-CM

## 2019-10-26 DIAGNOSIS — E119 Type 2 diabetes mellitus without complications: Secondary | ICD-10-CM | POA: Diagnosis not present

## 2019-10-26 LAB — HM DIABETES EYE EXAM

## 2020-02-22 NOTE — Progress Notes (Signed)
Established patient visit   Patient: Jose Barton   DOB: December 13, 1970   49 y.o. Male  MRN: [id number] Visit Date: 02/23/2020  Today's healthcare provider: Trey Sailors, PA-C   Chief Complaint  Patient presents with  . Diabetes  . Hyperlipidemia  . Hypertension  . Anxiety  I,Meighan Treto M Quandarius Nill,acting as a scribe for Trey Sailors, PA-C.,have documented all relevant documentation on the behalf of Trey Sailors, PA-C,as directed by  Trey Sailors, PA-C while in the presence of Trey Sailors, PA-C.  Subjective    HPI  Diabetes Mellitus Type II, follow-up  Lab Results  Component Value Date   HGBA1C 6.2 (A) 02/23/2020   HGBA1C 6.1 (A) 09/26/2019   HGBA1C 6.0 (H) 04/07/2019   Last seen for diabetes 5 months ago.  Management since then includes patient is currently not taking medication.  --------------------------------------------------------------------------------------------------- Hypertension, follow-up  BP Readings from Last 3 Encounters:  02/23/20 (!) 160/90  09/26/19 128/82  04/07/19 (!) 151/110   Wt Readings from Last 3 Encounters:  02/23/20 182 lb 3.2 oz (82.6 kg)  09/26/19 183 lb 3.2 oz (83.1 kg)  04/07/19 183 lb (83 kg)     He was last seen for hypertension 5 months ago.  BP at that visit was 128/82. Management since that visit includes patient reports he stopped taking medication. Not taking medications.  He reports poor compliance with treatment. He is not having side effects.  He is not exercising. He is adherent to low salt diet.   Outside blood pressures are not being checked.  He does not smoke.  Use of agents associated with hypertension: none.   --------------------------------------------------------------------------------------------------- Lipid/Cholesterol, follow-up  Last Lipid Panel: Lab Results  Component Value Date   CHOL 173 09/26/2019   LDLCALC 107 (H) 09/26/2019   HDL 53 09/26/2019   TRIG 67 09/26/2019     He was last seen for this 5 months ago.  Management since that visit includes patient reports he stopped taking his medication.  He reports poor compliance with treatment. He is not having side effects.   Symptoms: No appetite changes No foot ulcerations  No chest pain No chest pressure/discomfort  No dyspnea No orthopnea  No fatigue No lower extremity edema  No palpitations No paroxysmal nocturnal dyspnea  No nausea No numbness or tingling of extremity  No polydipsia No polyuria  No speech difficulty No syncope   He is following a Regular diet. Current exercise: no regular exercise  Last metabolic panel Lab Results  Component Value Date   GLUCOSE 111 (H) 09/26/2019   NA 142 09/26/2019   K 4.7 09/26/2019   BUN 13 09/26/2019   CREATININE 0.79 09/26/2019   GFRNONAA 106 09/26/2019   GFRAA 123 09/26/2019   CALCIUM 9.8 09/26/2019   AST 24 09/26/2019   ALT 24 09/26/2019   The 10-year ASCVD risk score Denman George DC Jr., et al., 2013) is: 17.3%  --------------------------------------------------------------------------------------------------- Anxiety, Follow-up  He was last seen for anxiety 5 months ago. Changes made at last visit include patient reports he stopped taking his Lexapro and Buspar.   He reports poor compliance with treatment. He reports fair to poor tolerance of treatment. He is not having side effects.   He feels his anxiety is moderate and Unchanged since last visit.  Symptoms: No chest pain Yes difficulty concentrating  No dizziness Yes fatigue  No feelings of losing control No insomnia  Yes irritable No palpitations  Yes panic attacks Yes racing  thoughts  No shortness of breath Yes sweating  Yes tremors/shakes    GAD-7 Results GAD-7 Generalized Anxiety Disorder Screening Tool 02/23/2020  1. Feeling Nervous, Anxious, or on Edge 3  2. Not Being Able to Stop or Control Worrying 3  3. Worrying Too Much About Different Things 3  4. Trouble Relaxing 3   5. Being So Restless it's Hard To Sit Still 3  6. Becoming Easily Annoyed or Irritable 3  7. Feeling Afraid As If Something Awful Might Happen 0  Total GAD-7 Score 18  Difficulty At Work, Home, or Getting  Along With Others? Very difficult    PHQ-9 Scores PHQ9 SCORE ONLY 02/23/2020 06/17/2018 07/13/2017  PHQ-9 Total Score 9 0 2    ---------------------------------------------------------------------------------------------------      Medications: Outpatient Medications Prior to Visit  Medication Sig  . clindamycin (CLEOCIN-T) 1 % external solution Apply topically 2 (two) times daily. (Patient not taking: Reported on 02/23/2020)  . [DISCONTINUED] busPIRone (BUSPAR) 5 MG tablet TAKE 1 TABLET BY MOUTH TWICE A DAY (Patient not taking: Reported on 02/23/2020)  . [DISCONTINUED] escitalopram (LEXAPRO) 20 MG tablet Take 1 tablet (20 mg total) by mouth daily. (Patient not taking: Reported on 02/23/2020)  . [DISCONTINUED] hydrOXYzine (ATARAX/VISTARIL) 10 MG tablet Take 1 tablet (10 mg total) by mouth 3 (three) times daily as needed. (Patient not taking: Reported on 02/23/2020)  . [DISCONTINUED] lisinopril (ZESTRIL) 20 MG tablet TAKE 1 TABLET BY MOUTH EVERY DAY (Patient not taking: Reported on 02/23/2020)  . [DISCONTINUED] meloxicam (MOBIC) 7.5 MG tablet TAKE 1 TABLET BY MOUTH EVERY DAY (Patient not taking: Reported on 02/23/2020)  . [DISCONTINUED] simvastatin (ZOCOR) 80 MG tablet TAKE 1 TABLET BY MOUTH EVERYDAY AT BEDTIME (Patient not taking: Reported on 02/23/2020)   No facility-administered medications prior to visit.    Review of Systems  Constitutional: Negative.   Respiratory: Negative.   Cardiovascular: Negative.   Hematological: Negative.       Objective    BP (!) 160/90 (BP Location: Right Arm, Patient Position: Sitting, Cuff Size: Normal)   Pulse 94   Temp 98.7 F (37.1 C) (Oral)   Wt 182 lb 3.2 oz (82.6 kg)   SpO2 97%   BMI 27.70 kg/m    Physical Exam Constitutional:       Appearance: Normal appearance.  Cardiovascular:     Rate and Rhythm: Normal rate and regular rhythm.     Pulses: Normal pulses.     Heart sounds: Normal heart sounds.  Pulmonary:     Effort: Pulmonary effort is normal.     Breath sounds: Normal breath sounds.  Skin:    General: Skin is warm and dry.  Neurological:     General: No focal deficit present.     Mental Status: He is alert and oriented to person, place, and time.  Psychiatric:        Mood and Affect: Mood normal.        Behavior: Behavior normal.       Results for orders placed or performed in visit on 02/23/20  POCT HgB A1C  Result Value Ref Range   Hemoglobin A1C 6.2 (A) 4.0 - 5.6 %   HbA1c POC (<> result, manual entry)     HbA1c, POC (prediabetic range)     HbA1c, POC (controlled diabetic range)     Est. average glucose Bld gHb Est-mCnc 131     Assessment & Plan    1. Hyperlipidemia associated with type 2 diabetes mellitus (HCC)  Noncompliant  with medication. Stressed importance, CVD risk etc.   - simvastatin (ZOCOR) 80 MG tablet; TAKE 1 TABLET BY MOUTH EVERYDAY AT BEDTIME  Dispense: 90 tablet; Refill: 1  2. Hypertension associated with diabetes (HCC)  Noncompliant with medication. Currently not taking.   - lisinopril (ZESTRIL) 20 MG tablet; Take 1 tablet (20 mg total) by mouth daily.  Dispense: 90 tablet; Refill: 1  3. Anxiety  Start zoloft as below.  - sertraline (ZOLOFT) 50 MG tablet; Take 1 tablet (50 mg total) by mouth daily.  Dispense: 90 tablet; Refill: 1  4. Controlled type 2 diabetes mellitus without complication, without long-term current use of insulin (HCC)  - POCT HgB A1C - Urine Microalbumin w/creat. ratio    Return in about 6 weeks (around 04/05/2020) for CPE and f/u.      ITrey Sailors, PA-C, have reviewed all documentation for this visit. The documentation on 02/23/20 for the exam, diagnosis, procedures, and orders are all accurate and complete.  The entirety of the  information documented in the History of Present Illness, Review of Systems and Physical Exam were personally obtained by me. Portions of this information were initially documented by [facility] and reviewed by me for thoroughness and accuracy.     Maryella Shivers  [facility] [phone] (phone) [phone] (fax)  [facility]

## 2020-02-23 ENCOUNTER — Encounter: Payer: Self-pay | Admitting: Physician Assistant

## 2020-02-23 ENCOUNTER — Other Ambulatory Visit: Payer: Self-pay

## 2020-02-23 ENCOUNTER — Ambulatory Visit: Payer: BC Managed Care – PPO | Admitting: Physician Assistant

## 2020-02-23 VITALS — BP 160/90 | HR 94 | Temp 98.7°F | Wt 182.2 lb

## 2020-02-23 DIAGNOSIS — E119 Type 2 diabetes mellitus without complications: Secondary | ICD-10-CM

## 2020-02-23 DIAGNOSIS — E1169 Type 2 diabetes mellitus with other specified complication: Secondary | ICD-10-CM | POA: Diagnosis not present

## 2020-02-23 DIAGNOSIS — I10 Essential (primary) hypertension: Secondary | ICD-10-CM

## 2020-02-23 DIAGNOSIS — I152 Hypertension secondary to endocrine disorders: Secondary | ICD-10-CM

## 2020-02-23 DIAGNOSIS — E785 Hyperlipidemia, unspecified: Secondary | ICD-10-CM

## 2020-02-23 DIAGNOSIS — E1159 Type 2 diabetes mellitus with other circulatory complications: Secondary | ICD-10-CM

## 2020-02-23 DIAGNOSIS — F419 Anxiety disorder, unspecified: Secondary | ICD-10-CM | POA: Diagnosis not present

## 2020-02-23 LAB — POCT GLYCOSYLATED HEMOGLOBIN (HGB A1C)
Est. average glucose Bld gHb Est-mCnc: 131
Hemoglobin A1C: 6.2 % — AB (ref 4.0–5.6)

## 2020-02-23 MED ORDER — SIMVASTATIN 80 MG PO TABS: ORAL_TABLET | ORAL | 1 refills | Status: DC

## 2020-02-23 MED ORDER — LISINOPRIL 20 MG PO TABS: 20.0000 mg | ORAL_TABLET | Freq: Every day | ORAL | 1 refills | Status: DC

## 2020-02-23 MED ORDER — SERTRALINE HCL 50 MG PO TABS: 50.0000 mg | ORAL_TABLET | Freq: Every day | ORAL | 1 refills | Status: DC

## 2020-02-23 NOTE — Patient Instructions (Signed)
Diabetes Mellitus and Exercise Exercising regularly is important for your overall health, especially when you have diabetes (diabetes mellitus). Exercising is not only about losing weight. It has many other health benefits, such as increasing muscle strength and bone density and reducing body fat and stress. This leads to improved fitness, flexibility, and endurance, all of which result in better overall health. Exercise has additional benefits for people with diabetes, including:  Reducing appetite.  Helping to lower and control blood glucose.  Lowering blood pressure.  Helping to control amounts of fatty substances (lipids) in the blood, such as cholesterol and triglycerides.  Helping the body to respond better to insulin (improving insulin sensitivity).  Reducing how much insulin the body needs.  Decreasing the risk for heart disease by: ? Lowering cholesterol and triglyceride levels. ? Increasing the levels of good cholesterol. ? Lowering blood glucose levels. What is my activity plan? Your health care provider or certified diabetes educator can help you make a plan for the type and frequency of exercise (activity plan) that works for you. Make sure that you:  Do at least 150 minutes of moderate-intensity or vigorous-intensity exercise each week. This could be brisk walking, biking, or water aerobics. ? Do stretching and strength exercises, such as yoga or weightlifting, at least 2 times a week. ? Spread out your activity over at least 3 days of the week.  Get some form of physical activity every day. ? Do not go more than 2 days in a row without some kind of physical activity. ? Avoid being inactive for more than 30 minutes at a time. Take frequent breaks to walk or stretch.  Choose a type of exercise or activity that you enjoy, and set realistic goals.  Start slowly, and gradually increase the intensity of your exercise over time. What do I need to know about managing my  diabetes?   Check your blood glucose before and after exercising. ? If your blood glucose is 240 mg/dL (13.3 mmol/L) or higher before you exercise, check your urine for ketones. If you have ketones in your urine, do not exercise until your blood glucose returns to normal. ? If your blood glucose is 100 mg/dL (5.6 mmol/L) or lower, eat a snack containing 15-20 grams of carbohydrate. Check your blood glucose 15 minutes after the snack to make sure that your level is above 100 mg/dL (5.6 mmol/L) before you start your exercise.  Know the symptoms of low blood glucose (hypoglycemia) and how to treat it. Your risk for hypoglycemia increases during and after exercise. Common symptoms of hypoglycemia can include: ? Hunger. ? Anxiety. ? Sweating and feeling clammy. ? Confusion. ? Dizziness or feeling light-headed. ? Increased heart rate or palpitations. ? Blurry vision. ? Tingling or numbness around the mouth, lips, or tongue. ? Tremors or shakes. ? Irritability.  Keep a rapid-acting carbohydrate snack available before, during, and after exercise to help prevent or treat hypoglycemia.  Avoid injecting insulin into areas of the body that are going to be exercised. For example, avoid injecting insulin into: ? The arms, when playing tennis. ? The legs, when jogging.  Keep records of your exercise habits. Doing this can help you and your health care provider adjust your diabetes management plan as needed. Write down: ? Food that you eat before and after you exercise. ? Blood glucose levels before and after you exercise. ? The type and amount of exercise you have done. ? When your insulin is expected to peak, if you use   insulin. Avoid exercising at times when your insulin is peaking.  When you start a new exercise or activity, work with your health care provider to make sure the activity is safe for you, and to adjust your insulin, medicines, or food intake as needed.  Drink plenty of water while  you exercise to prevent dehydration or heat stroke. Drink enough fluid to keep your urine clear or pale yellow. Summary  Exercising regularly is important for your overall health, especially when you have diabetes (diabetes mellitus).  Exercising has many health benefits, such as increasing muscle strength and bone density and reducing body fat and stress.  Your health care provider or certified diabetes educator can help you make a plan for the type and frequency of exercise (activity plan) that works for you.  When you start a new exercise or activity, work with your health care provider to make sure the activity is safe for you, and to adjust your insulin, medicines, or food intake as needed. This information is not intended to replace advice given to you by your health care provider. Make sure you discuss any questions you have with your health care provider. Document Revised: 12/24/2016 Document Reviewed: 11/11/2015 Elsevier Patient Education  2020 [employer]  

## 2020-02-29 DIAGNOSIS — M25562 Pain in left knee: Secondary | ICD-10-CM | POA: Diagnosis not present

## 2020-03-27 ENCOUNTER — Encounter: Payer: Self-pay | Admitting: Physician Assistant

## 2020-04-08 NOTE — Progress Notes (Signed)
Complete physical exam   Patient: Jose Barton   DOB: 10-29-1970   49 y.o. Male  MRN: [id number] Visit Date: 04/09/2020  Today's healthcare provider: Trey Sailors, PA-C   Chief Complaint  Patient presents with  . Annual Exam  . Hypertension   Subjective    Jose Barton is a 49 y.o. male who presents today for a complete physical exam.  He reports consuming a general diet. The patient does not participate in regular exercise at present. He generally feels well. He reports sleeping well. He does not have additional problems to discuss today.   Hypertension, follow-up  BP Readings from Last 3 Encounters:  04/09/20 131/80  02/23/20 (!) 160/90  09/26/19 128/82   Wt Readings from Last 3 Encounters:  04/09/20 178 lb (80.7 kg)  02/23/20 182 lb 3.2 oz (82.6 kg)  09/26/19 183 lb 3.2 oz (83.1 kg)     He was last seen for hypertension 2 months ago.  BP at that visit was 160/90. Management since that visit includes start taking medication consistently.  He reports good compliance with treatment. He is not having side effects.  He is following a Regular diet. He is not exercising. He does not smoke.  Use of agents associated with hypertension: none.   Outside blood pressures are not being checked. Symptoms: No chest pain No chest pressure  No palpitations No syncope  No dyspnea No orthopnea  No paroxysmal nocturnal dyspnea No lower extremity edema   Pertinent labs: Lab Results  Component Value Date   CHOL 173 09/26/2019   HDL 53 09/26/2019   LDLCALC 107 (H) 09/26/2019   TRIG 67 09/26/2019   CHOLHDL 3.3 09/26/2019   Lab Results  Component Value Date   NA 142 09/26/2019   K 4.7 09/26/2019   CREATININE 0.79 09/26/2019   GFRNONAA 106 09/26/2019   GFRAA 123 09/26/2019   GLUCOSE 111 (H) 09/26/2019     The 10-year ASCVD risk score Denman George DC Jr., et al., 2013) is: 12.4%   Lipid/Cholesterol, Follow-up  Last lipid panel Other pertinent labs  Lab Results   Component Value Date   CHOL 173 09/26/2019   HDL 53 09/26/2019   LDLCALC 107 (H) 09/26/2019   TRIG 67 09/26/2019   CHOLHDL 3.3 09/26/2019   Lab Results  Component Value Date   ALT 24 09/26/2019   AST 24 09/26/2019   PLT 310 07/13/2017   TSH 1.740 07/13/2017     He was last seen for this 2 months ago.  Management since that visit includes start taking simvastatin 80mg  consistently.  He reports good compliance with treatment. He is not having side effects.    Anxiety, Follow-up  He was last seen for anxiety 2 months ago. Changes made at last visit include starting Zoloft 50mg  daily.   He reports good compliance with treatment. He reports good tolerance of treatment. He is not having side effects.   He feels his anxiety is moderate and Improved since last visit.  Symptoms: No chest pain No difficulty concentrating  No dizziness No fatigue  No feelings of losing control No insomnia  No irritable No palpitations  No panic attacks No racing thoughts  No shortness of breath No sweating  No tremors/shakes    GAD-7 Results GAD-7 Generalized Anxiety Disorder Screening Tool 02/23/2020  1. Feeling Nervous, Anxious, or on Edge 3  2. Not Being Able to Stop or Control Worrying 3  3. Worrying Too Much About Different Things  3  4. Trouble Relaxing 3  5. Being So Restless it's Hard To Sit Still 3  6. Becoming Easily Annoyed or Irritable 3  7. Feeling Afraid As If Something Awful Might Happen 0  Total GAD-7 Score 18  Difficulty At Work, Home, or Getting  Along With Others? Very difficult    PHQ-9 Scores PHQ9 SCORE ONLY 04/09/2020 02/23/2020 06/17/2018  PHQ-9 Total Score 0 9 0      Past Medical History:  Diagnosis Date  . Anxiety   . Diabetes mellitus without complication (HCC)   . Hypertension associated with diabetes (HCC) 08/13/2017  . Pulmonary edema    Past Surgical History:  Procedure Laterality Date  . NO PAST SURGERIES     Social History   Socioeconomic  History  . Marital status: Married    Spouse name: Not on file  . Number of children: Not on file  . Years of education: Not on file  . Highest education level: Not on file  Occupational History  . Not on file  Tobacco Use  . Smoking status: Current Every Day Smoker    Packs/day: 0.50    Years: 30.00    Pack years: 15.00    Types: Cigarettes  . Smokeless tobacco: Never Used  Vaping Use  . Vaping Use: Never used  Substance and Sexual Activity  . Alcohol use: Yes    Alcohol/week: 24.0 standard drinks    Types: 24 Cans of beer per week    Comment: daily  . Drug use: No  . Sexual activity: Not on file  Other Topics Concern  . Not on file  Social History Narrative  . Not on file   Social Determinants of Health   Financial Resource Strain:   . Difficulty of Paying Living Expenses: Not on file  Food Insecurity:   . Worried About Programme researcher, broadcasting/film/videounning Out of Food in the Last Year: Not on file  . Ran Out of Food in the Last Year: Not on file  Transportation Needs:   . Lack of Transportation (Medical): Not on file  . Lack of Transportation (Non-Medical): Not on file  Physical Activity:   . Days of Exercise per Week: Not on file  . Minutes of Exercise per Session: Not on file  Stress:   . Feeling of Stress : Not on file  Social Connections:   . Frequency of Communication with Friends and Family: Not on file  . Frequency of Social Gatherings with Friends and Family: Not on file  . Attends Religious Services: Not on file  . Active Member of Clubs or Organizations: Not on file  . Attends BankerClub or Organization Meetings: Not on file  . Marital Status: Not on file  Intimate Partner Violence:   . Fear of Current or Ex-Partner: Not on file  . Emotionally Abused: Not on file  . Physically Abused: Not on file  . Sexually Abused: Not on file   Family Status  Relation Name Status  . Mother  Deceased at age 49  . Father  Deceased at age 49  . Sister  Alive   Family History  Problem Relation  Age of Onset  . Lung cancer Mother   . Heart attack Father   . Diabetes Father   . Hypertension Father   . Breast cancer Sister    Allergies  Allergen Reactions  . Codeine Hives  . Percocet [Oxycodone-Acetaminophen] Hives  . Vicodin [Hydrocodone-Acetaminophen] Hives  . Bupropion Swelling and Hives    Patient Care Team:  Jose Barton as PCP - General (Physician Assistant)   Medications: Outpatient Medications Prior to Visit  Medication Sig  . lisinopril (ZESTRIL) 20 MG tablet Take 1 tablet (20 mg total) by mouth daily.  . sertraline (ZOLOFT) 50 MG tablet Take 1 tablet (50 mg total) by mouth daily.  . simvastatin (ZOCOR) 80 MG tablet TAKE 1 TABLET BY MOUTH EVERYDAY AT BEDTIME  . clindamycin (CLEOCIN-T) 1 % external solution Apply topically 2 (two) times daily. (Patient not taking: Reported on 02/23/2020)   No facility-administered medications prior to visit.    Review of Systems  Constitutional: Negative.   HENT: Negative.   Eyes: Negative.   Respiratory: Negative.   Cardiovascular: Negative.   Gastrointestinal: Negative.   Endocrine: Negative.   Genitourinary: Negative.   Musculoskeletal: Negative.   Skin: Negative.   Allergic/Immunologic: Negative.   Neurological: Negative.   Hematological: Negative.   Psychiatric/Behavioral: Negative.       Objective    BP 131/80   Pulse 86   Temp 99 F (37.2 C)   Resp 16   Ht 5\' 8"  (1.727 m)   Wt 178 lb (80.7 kg)   BMI 27.06 kg/m    Physical Exam Constitutional:      Appearance: Normal appearance.  HENT:     Right Ear: Tympanic membrane normal.     Left Ear: Tympanic membrane normal.  Eyes:     Pupils: Pupils are equal, round, and reactive to light.  Cardiovascular:     Rate and Rhythm: Normal rate and regular rhythm.     Pulses: Normal pulses.     Heart sounds: Normal heart sounds.  Pulmonary:     Effort: Pulmonary effort is normal.     Breath sounds: Normal breath sounds.  Abdominal:     General:  Bowel sounds are normal.     Palpations: Abdomen is soft.  Musculoskeletal:        General: Normal range of motion.  Skin:    General: Skin is warm and dry.  Neurological:     Mental Status: He is alert and oriented to person, place, and time. Mental status is at baseline.  Psychiatric:        Mood and Affect: Mood normal.        Behavior: Behavior normal.     Last depression screening scores PHQ 2/9 Scores 04/09/2020 02/23/2020 06/17/2018  PHQ - 2 Score 0 2 0  PHQ- 9 Score 0 9 0   Last fall risk screening Fall Risk  06/17/2018  Falls in the past year? 1  Comment 15 feet off a ladder at work and patient stated he got back up and continue to work.  Number falls in past yr: 0  Injury with Fall? 1  Comment PAtient states he had a bad bruise on his side and back.   Last Audit-C alcohol use screening Alcohol Use Disorder Test (AUDIT) 06/17/2018  1. How often do you have a drink containing alcohol? 4  2. How many drinks containing alcohol do you have on a typical day when you are drinking? 1  3. How often do you have six or more drinks on one occasion? 3  AUDIT-C Score 8  4. How often during the last year have you found that you were not able to stop drinking once you had started? 0  5. How often during the last year have you failed to do what was normally expected from you because of drinking? 0  6. How often  during the last year have you needed a first drink in the morning to get yourself going after a heavy drinking session? 0  7. How often during the last year have you had a feeling of guilt of remorse after drinking? 0  8. How often during the last year have you been unable to remember what happened the night before because you had been drinking? 0  9. Have you or someone else been injured as a result of your drinking? 0  10. Has a relative or friend or a doctor or another health worker been concerned about your drinking or suggested you cut down? 0  Alcohol Use Disorder Identification  Test Final Score (AUDIT) 8   A score of 3 or more in women, and 4 or more in men indicates increased risk for alcohol abuse, EXCEPT if all of the points are from question 1   No results found for any visits on 04/09/20.  Assessment & Plan    Routine Health Maintenance and Physical Exam  Exercise Activities and Dietary recommendations Goals   None     Immunization History  Administered Date(s) Administered  . Pneumococcal Polysaccharide-23 08/13/2017  . Tdap 04/24/2014    Health Maintenance  Topic Date Due  . Hepatitis C Screening  Never done  . HIV Screening  Never done  . COVID-19 Vaccine (1) 04/25/2020 (Originally [birth year]/1982)  . INFLUENZA VACCINE  09/12/2020 (Originally 01/14/2020)  . HEMOGLOBIN A1C  08/22/2020  . OPHTHALMOLOGY EXAM  10/25/2020  . FOOT EXAM  02/22/2021  . TETANUS/TDAP  04/24/2024  . PNEUMOCOCCAL POLYSACCHARIDE VACCINE AGE 21-64 HIGH RISK  Completed    Discussed health benefits of physical activity, and encouraged him to engage in regular exercise appropriate for his age and condition.  1. Annual physical exam   2. Hypertension associated with diabetes (HCC)  Well controlled Continue current medications Recheck metabolic panel F/u in 6 months  3. Hyperlipidemia associated with type 2 diabetes mellitus (HCC)  Previously well controlled Continue statin Repeat FLP and CMP Goal LDL < 70  4. Anxiety  Improved from last time, continue zoloft.   5. Controlled Type II Diabetes  This has been trending upwards and counseled patient that diabetes is chronic illness that tends to progress over time. He may need medications for glucose at some point.   No follow-ups on file.     ITrey Sailors, PA-C, have reviewed all documentation for this visit. The documentation on 04/09/20 for the exam, diagnosis, procedures, and orders are all accurate and complete.  The entirety of the information documented in the History of Present Illness, Review of  Systems and Physical Exam were personally obtained by me. Portions of this information were initially documented by Jose Barton, CMA and reviewed by me for thoroughness and accuracy.     Jose Barton  [facility] [phone] (phone) [phone] (fax)  [facility]

## 2020-04-09 ENCOUNTER — Encounter: Payer: Self-pay | Admitting: Physician Assistant

## 2020-04-09 ENCOUNTER — Ambulatory Visit: Payer: BC Managed Care – PPO | Admitting: Physician Assistant

## 2020-04-09 ENCOUNTER — Other Ambulatory Visit: Payer: Self-pay

## 2020-04-09 VITALS — BP 131/80 | HR 86 | Temp 99.0°F | Resp 16 | Ht 68.0 in | Wt 178.0 lb

## 2020-04-09 DIAGNOSIS — F419 Anxiety disorder, unspecified: Secondary | ICD-10-CM | POA: Diagnosis not present

## 2020-04-09 DIAGNOSIS — E1159 Type 2 diabetes mellitus with other circulatory complications: Secondary | ICD-10-CM

## 2020-04-09 DIAGNOSIS — I152 Hypertension secondary to endocrine disorders: Secondary | ICD-10-CM

## 2020-04-09 DIAGNOSIS — Z0000 Encounter for general adult medical examination without abnormal findings: Secondary | ICD-10-CM

## 2020-04-09 DIAGNOSIS — E1169 Type 2 diabetes mellitus with other specified complication: Secondary | ICD-10-CM

## 2020-04-09 DIAGNOSIS — E785 Hyperlipidemia, unspecified: Secondary | ICD-10-CM | POA: Diagnosis not present

## 2020-04-09 DIAGNOSIS — E119 Type 2 diabetes mellitus without complications: Secondary | ICD-10-CM | POA: Diagnosis not present

## 2020-04-09 NOTE — Patient Instructions (Signed)
Diabetes Mellitus and Exercise Exercising regularly is important for your overall health, especially when you have diabetes (diabetes mellitus). Exercising is not only about losing weight. It has many other health benefits, such as increasing muscle strength and bone density and reducing body fat and stress. This leads to improved fitness, flexibility, and endurance, all of which result in better overall health. Exercise has additional benefits for people with diabetes, including:  Reducing appetite.  Helping to lower and control blood glucose.  Lowering blood pressure.  Helping to control amounts of fatty substances (lipids) in the blood, such as cholesterol and triglycerides.  Helping the body to respond better to insulin (improving insulin sensitivity).  Reducing how much insulin the body needs.  Decreasing the risk for heart disease by: ? Lowering cholesterol and triglyceride levels. ? Increasing the levels of good cholesterol. ? Lowering blood glucose levels. What is my activity plan? Your health care provider or certified diabetes educator can help you make a plan for the type and frequency of exercise (activity plan) that works for you. Make sure that you:  Do at least 150 minutes of moderate-intensity or vigorous-intensity exercise each week. This could be brisk walking, biking, or water aerobics. ? Do stretching and strength exercises, such as yoga or weightlifting, at least 2 times a week. ? Spread out your activity over at least 3 days of the week.  Get some form of physical activity every day. ? Do not go more than 2 days in a row without some kind of physical activity. ? Avoid being inactive for more than 30 minutes at a time. Take frequent breaks to walk or stretch.  Choose a type of exercise or activity that you enjoy, and set realistic goals.  Start slowly, and gradually increase the intensity of your exercise over time. What do I need to know about managing my  diabetes?   Check your blood glucose before and after exercising. ? If your blood glucose is 240 mg/dL (13.3 mmol/L) or higher before you exercise, check your urine for ketones. If you have ketones in your urine, do not exercise until your blood glucose returns to normal. ? If your blood glucose is 100 mg/dL (5.6 mmol/L) or lower, eat a snack containing 15-20 grams of carbohydrate. Check your blood glucose 15 minutes after the snack to make sure that your level is above 100 mg/dL (5.6 mmol/L) before you start your exercise.  Know the symptoms of low blood glucose (hypoglycemia) and how to treat it. Your risk for hypoglycemia increases during and after exercise. Common symptoms of hypoglycemia can include: ? Hunger. ? Anxiety. ? Sweating and feeling clammy. ? Confusion. ? Dizziness or feeling light-headed. ? Increased heart rate or palpitations. ? Blurry vision. ? Tingling or numbness around the mouth, lips, or tongue. ? Tremors or shakes. ? Irritability.  Keep a rapid-acting carbohydrate snack available before, during, and after exercise to help prevent or treat hypoglycemia.  Avoid injecting insulin into areas of the body that are going to be exercised. For example, avoid injecting insulin into: ? The arms, when playing tennis. ? The legs, when jogging.  Keep records of your exercise habits. Doing this can help you and your health care provider adjust your diabetes management plan as needed. Write down: ? Food that you eat before and after you exercise. ? Blood glucose levels before and after you exercise. ? The type and amount of exercise you have done. ? When your insulin is expected to peak, if you use   insulin. Avoid exercising at times when your insulin is peaking.  When you start a new exercise or activity, work with your health care provider to make sure the activity is safe for you, and to adjust your insulin, medicines, or food intake as needed.  Drink plenty of water while  you exercise to prevent dehydration or heat stroke. Drink enough fluid to keep your urine clear or pale yellow. Summary  Exercising regularly is important for your overall health, especially when you have diabetes (diabetes mellitus).  Exercising has many health benefits, such as increasing muscle strength and bone density and reducing body fat and stress.  Your health care provider or certified diabetes educator can help you make a plan for the type and frequency of exercise (activity plan) that works for you.  When you start a new exercise or activity, work with your health care provider to make sure the activity is safe for you, and to adjust your insulin, medicines, or food intake as needed. This information is not intended to replace advice given to you by your health care provider. Make sure you discuss any questions you have with your health care provider. Document Revised: 12/24/2016 Document Reviewed: 11/11/2015 Elsevier Patient Education  2020 [employer]  

## 2020-04-10 LAB — COMPREHENSIVE METABOLIC PANEL
ALT: 21 IU/L (ref 0–44)
AST: 17 IU/L (ref 0–40)
Albumin/Globulin Ratio: 2.5 — ABNORMAL HIGH (ref 1.2–2.2)
Albumin: 4.8 g/dL (ref 4.0–5.0)
Alkaline Phosphatase: 84 IU/L (ref 44–121)
BUN/Creatinine Ratio: 18 (ref 9–20)
BUN: 16 mg/dL (ref 6–24)
Bilirubin Total: 0.3 mg/dL (ref 0.0–1.2)
CO2: 22 mmol/L (ref 20–29)
Calcium: 9.3 mg/dL (ref 8.7–10.2)
Chloride: 100 mmol/L (ref 96–106)
Creatinine, Ser: 0.88 mg/dL (ref 0.76–1.27)
GFR calc Af Amer: 117 mL/min/{1.73_m2} (ref >59)
GFR calc non Af Amer: 101 mL/min/{1.73_m2} (ref >59)
Globulin, Total: 1.9 g/dL (ref 1.5–4.5)
Glucose: 200 mg/dL — ABNORMAL HIGH (ref 65–99)
Potassium: 4.2 mmol/L (ref 3.5–5.2)
Sodium: 139 mmol/L (ref 134–144)
Total Protein: 6.7 g/dL (ref 6.0–8.5)

## 2020-04-10 LAB — LIPID PANEL
Chol/HDL Ratio: 3.2 ratio (ref 0.0–5.0)
Cholesterol, Total: 158 mg/dL (ref 100–199)
HDL: 50 mg/dL (ref >39)
LDL Chol Calc (NIH): 82 mg/dL (ref 0–99)
Triglycerides: 148 mg/dL (ref 0–149)
VLDL Cholesterol Cal: 26 mg/dL (ref 5–40)

## 2020-05-14 DIAGNOSIS — Z03818 Encounter for observation for suspected exposure to other biological agents ruled out: Secondary | ICD-10-CM | POA: Diagnosis not present

## 2020-08-20 ENCOUNTER — Other Ambulatory Visit: Payer: Self-pay | Admitting: Physician Assistant

## 2020-08-20 DIAGNOSIS — F419 Anxiety disorder, unspecified: Secondary | ICD-10-CM

## 2020-08-29 ENCOUNTER — Ambulatory Visit
Admission: RE | Admit: 2020-08-29 | Discharge: 2020-08-29 | Disposition: A | Discharge status: Home / Self Care | Payer: BC Managed Care – PPO | Source: Ambulatory Visit | Attending: Adult Health | Admitting: Adult Health

## 2020-08-29 ENCOUNTER — Ambulatory Visit: Payer: BC Managed Care – PPO | Admitting: Adult Health

## 2020-08-29 ENCOUNTER — Encounter: Payer: Self-pay | Admitting: Adult Health

## 2020-08-29 ENCOUNTER — Other Ambulatory Visit: Payer: Self-pay

## 2020-08-29 VITALS — BP 145/90 | HR 91 | Resp 16 | Wt 177.4 lb

## 2020-08-29 DIAGNOSIS — M62838 Other muscle spasm: Secondary | ICD-10-CM | POA: Diagnosis not present

## 2020-08-29 DIAGNOSIS — R0602 Shortness of breath: Secondary | ICD-10-CM | POA: Diagnosis not present

## 2020-08-29 DIAGNOSIS — M898X1 Other specified disorders of bone, shoulder: Secondary | ICD-10-CM | POA: Diagnosis not present

## 2020-08-29 DIAGNOSIS — M25511 Pain in right shoulder: Secondary | ICD-10-CM | POA: Diagnosis not present

## 2020-08-29 MED ORDER — CYCLOBENZAPRINE HCL 10 MG PO TABS: 10.0000 mg | ORAL_TABLET | Freq: Three times a day (TID) | ORAL | 0 refills | Status: DC | PRN

## 2020-08-29 MED ORDER — PREDNISONE 10 MG (21) PO TBPK: ORAL_TABLET | ORAL | 0 refills | Status: DC

## 2020-08-29 NOTE — Patient Instructions (Signed)
Muscle Cramps and Spasms Muscle cramps and spasms are when muscles tighten by themselves. They usually get better within minutes. Muscle cramps are painful. They are usually stronger and last longer than muscle spasms. Muscle spasms may or may not be painful. They can last a few seconds or much longer. Cramps and spasms can affect any muscle, but they occur most often in the calf muscles of the leg. They are usually not caused by a serious problem. In many cases, the cause is not known. Some common causes include:  Doing more physical work or exercise than your body is ready for.  Using the muscles too much (overuse) by repeating certain movements too many times.  Staying in a certain position for a long time.  Playing a sport or doing an activity without preparing properly.  Using bad form or technique while playing a sport or doing an activity.  Not having enough water in your body (dehydration).  Injury.  Side effects of some medicines.  Low levels of the salts and minerals in your blood (electrolytes), such as low potassium or calcium. Follow these instructions at home: Managing pain and stiffness  Massage, stretch, and relax the muscle. Do this for many minutes at a time.  If told, put heat on tight or tense muscles as often as told by your doctor. Use the heat source that your doctor recommends, such as a moist heat pack or a heating pad. ? Place a towel between your skin and the heat source. ? Leave the heat on for 20-30 minutes. ? Remove the heat if your skin turns bright red. This is very important if you are not able to feel pain, heat, or cold. You may have a greater risk of getting burned.  If told, put ice on the affected area. This may help if you are sore or have pain after a cramp or spasm. ? Put ice in a plastic bag. ? Place a towel between your skin and the bag. ? Leave the ice on for 20 minutes, 2-3 times a day.  Try taking hot showers or baths to help relax  tight muscles.      Eating and drinking  Drink enough fluid to keep your pee (urine) pale yellow.  Eat a healthy diet to help ensure that your muscles work well. This should include: ? Fruits and vegetables. ? Lean protein. ? Whole grains. ? Low-fat or nonfat dairy products. General instructions  If you are having cramps often, avoid intense exercise for several days.  Take over-the-counter and prescription medicines only as told by your doctor.  Watch for any changes in your symptoms.  Keep all follow-up visits as told by your doctor. This is important. Contact a doctor if:  Your cramps or spasms get worse or happen more often.  Your cramps or spasms do not get better with time. Summary  Muscle cramps and spasms are when muscles tighten by themselves. They usually get better within minutes.  Cramps and spasms occur most often in the calf muscles of the leg.  Massage, stretch, and relax the muscle. This may help the cramp or spasm go away.  Drink enough fluid to keep your pee (urine) pale yellow. This information is not intended to replace advice given to you by your health care provider. Make sure you discuss any questions you have with your health care provider. Document Revised: 10/25/2017 Document Reviewed: 10/25/2017 Elsevier Patient Education  2021 [employer]  

## 2020-08-29 NOTE — Progress Notes (Signed)
Right sacpula x ray and chest x ray are within normal limits.

## 2020-08-29 NOTE — Progress Notes (Signed)
Scapula x ray and chest x ray were within normal limits.

## 2020-08-29 NOTE — Progress Notes (Signed)
Established patient visit   Patient: Jose Barton   DOB: 1970/12/15   50 y.o. Male  MRN: [id number] Visit Date: 08/29/2020  Today's healthcare provider: Jairo Ben, FNP   Chief Complaint  Patient presents with  . Shoulder Pain   Subjective    Patient is a 50 year old male who comes to the office with right scapular pain, patient has pain with sitting and with moving.  Taking a deep breath or moving causes increased pain in his right scapula.  He denies any gastrointestinal symptoms. He does have an occasional cough.  Coughing does make his pain increased.  Movement makes pain worse.  Denies any neck pain jaw pain arm pain.  Denies any pain in shoulders. Pain with a deep breath.  Denies any injury.  Denies any burning or tingling. He has been working and moving his body a lot but other than that has had no significant changes. Patient  denies any fever, body aches,chills, rash, chest pain, shortness of breath, nausea, vomiting, or diarrhea.  Denies dizziness, lightheadedness, pre syncopal or syncopal episodes.      Past Medical History:  Diagnosis Date  . Anxiety   . Diabetes mellitus without complication (HCC)   . Hypertension associated with diabetes (HCC) 08/13/2017  . Pulmonary edema    Past Surgical History:  Procedure Laterality Date  . NO PAST SURGERIES     Social History   Tobacco Use  . Smoking status: Current Every Day Smoker    Packs/day: 0.50    Years: 30.00    Pack years: 15.00    Types: Cigarettes  . Smokeless tobacco: Never Used  Vaping Use  . Vaping Use: Never used  Substance Use Topics  . Alcohol use: Yes    Alcohol/week: 24.0 standard drinks    Types: 24 Cans of beer per week    Comment: daily  . Drug use: No   Social History   Socioeconomic History  . Marital status: Married    Spouse name: Not on file  . Number of children: Not on file  . Years of education: Not on file  . Highest education level: Not on file   Occupational History  . Not on file  Tobacco Use  . Smoking status: Current Every Day Smoker    Packs/day: 0.50    Years: 30.00    Pack years: 15.00    Types: Cigarettes  . Smokeless tobacco: Never Used  Vaping Use  . Vaping Use: Never used  Substance and Sexual Activity  . Alcohol use: Yes    Alcohol/week: 24.0 standard drinks    Types: 24 Cans of beer per week    Comment: daily  . Drug use: No  . Sexual activity: Not on file  Other Topics Concern  . Not on file  Social History Narrative  . Not on file   Social Determinants of Health   Financial Resource Strain: Not on file  Food Insecurity: Not on file  Transportation Needs: Not on file  Physical Activity: Not on file  Stress: Not on file  Social Connections: Not on file  Intimate Partner Violence: Not on file   Family Status  Relation Name Status  . Mother  Deceased at age 7  . Father  Deceased at age 55  . Sister  Alive   Family History  Problem Relation Age of Onset  . Lung cancer Mother   . Heart attack Father   . Diabetes Father   .  Hypertension Father   . Breast cancer Sister    Allergies  Allergen Reactions  . Codeine Hives  . Percocet [Oxycodone-Acetaminophen] Hives  . Vicodin [Hydrocodone-Acetaminophen] Hives  . Bupropion Swelling and Hives       Medications: Outpatient Medications Prior to Visit  Medication Sig  . lisinopril (ZESTRIL) 20 MG tablet Take 1 tablet (20 mg total) by mouth daily.  . sertraline (ZOLOFT) 50 MG tablet TAKE 1 TABLET BY MOUTH EVERY DAY  . simvastatin (ZOCOR) 80 MG tablet TAKE 1 TABLET BY MOUTH EVERYDAY AT BEDTIME  . clindamycin (CLEOCIN-T) 1 % external solution Apply topically 2 (two) times daily. (Patient not taking: No sig reported)   No facility-administered medications prior to visit.   Review of Systems  Constitutional: Negative.   HENT: Negative.   Respiratory: Positive for cough. Negative for hemoptysis, sputum production, shortness of breath and  wheezing.   Cardiovascular: Negative.   Gastrointestinal: Negative for abdominal pain, blood in stool, constipation, diarrhea, heartburn, melena, nausea and vomiting.  Genitourinary: Negative.   Musculoskeletal: Positive for back pain and myalgias.  Skin: Negative for itching and rash.  Neurological: Negative for dizziness and tingling.      Last CBC Lab Results  Component Value Date   WBC 13.5 (H) 07/13/2017   HGB 16.5 07/13/2017   HCT 47.4 07/13/2017   MCV 93 07/13/2017   MCH 32.5 07/13/2017   RDW 13.4 07/13/2017   PLT 310 07/13/2017   Last metabolic panel Lab Results  Component Value Date   GLUCOSE 200 (H) 04/09/2020   NA 139 04/09/2020   K 4.2 04/09/2020   CL 100 04/09/2020   CO2 22 04/09/2020   BUN 16 04/09/2020   CREATININE 0.88 04/09/2020   GFRNONAA 101 04/09/2020   GFRAA 117 04/09/2020   CALCIUM 9.3 04/09/2020   PROT 6.7 04/09/2020   ALBUMIN 4.8 04/09/2020   LABGLOB 1.9 04/09/2020   AGRATIO 2.5 (H) 04/09/2020   BILITOT 0.3 04/09/2020   ALKPHOS 84 04/09/2020   AST 17 04/09/2020   ALT 21 04/09/2020   ANIONGAP 5 04/03/2017       Objective    BP (!) 145/90   Pulse 91   Resp 16   Wt 177 lb 6.4 oz (80.5 kg)   SpO2 100%   BMI 26.97 kg/m  BP Readings from Last 3 Encounters:  08/29/20 (!) 145/90  04/09/20 131/80  02/23/20 (!) 160/90   Wt Readings from Last 3 Encounters:  08/29/20 177 lb 6.4 oz (80.5 kg)  04/09/20 178 lb (80.7 kg)  02/23/20 182 lb 3.2 oz (82.6 kg)       Physical Exam Vitals and nursing note reviewed.  Constitutional:      General: He is not in acute distress.    Appearance: Normal appearance. He is well-developed. He is not ill-appearing, toxic-appearing or diaphoretic.     Comments: Patient is alert and oriented and responsive to questions Engages in eye contact with provider. Speaks in full sentences without any pauses without any shortness of breath or distress.    HENT:     Head: Normocephalic and atraumatic.     Right  Ear: Hearing, tympanic membrane, ear canal and external ear normal.     Left Ear: Hearing, tympanic membrane, ear canal and external ear normal.     Nose: Nose normal.     Mouth/Throat:     Pharynx: Uvula midline. No oropharyngeal exudate.  Eyes:     General: Lids are normal. No scleral icterus.  Right eye: No discharge.        Left eye: No discharge.     Conjunctiva/sclera: Conjunctivae normal.     Pupils: Pupils are equal, round, and reactive to light.  Neck:     Thyroid: No thyromegaly.     Vascular: Normal carotid pulses. No carotid bruit, hepatojugular reflux or JVD.     Trachea: Trachea and phonation normal. No tracheal tenderness or tracheal deviation.     Meningeal: Brudzinski's sign absent.  Cardiovascular:     Rate and Rhythm: Normal rate and regular rhythm.     Pulses: Normal pulses.     Heart sounds: Normal heart sounds, S1 normal and S2 normal. Heart sounds not distant. No murmur heard. No friction rub. No gallop.   Pulmonary:     Effort: Pulmonary effort is normal. No accessory muscle usage or respiratory distress.     Breath sounds: Normal breath sounds. No stridor. No wheezing, rhonchi or rales.  Chest:     Chest wall: No tenderness.  Abdominal:     General: Bowel sounds are normal. There is no distension.     Palpations: Abdomen is soft. There is no mass.     Tenderness: There is no abdominal tenderness. There is no guarding or rebound.     Hernia: No hernia is present.  Musculoskeletal:        General: Tenderness present. No deformity.     Cervical back: Full passive range of motion without pain, normal range of motion and neck supple.     Thoracic back: Spasms (as on diagram #1. reproducible with movement pain and pain with touching ) and tenderness present.       Back:     Comments: Patient moves on and off of exam table and in room without difficulty. Gait is normal in hall and in room. Patient is oriented to person place time and situation. Patient  answers questions appropriately and engages in conversation.   NO rash or Zoster   Lymphadenopathy:     Head:     Right side of head: No submental, submandibular, tonsillar, preauricular, posterior auricular or occipital adenopathy.     Left side of head: No submental, submandibular, tonsillar, preauricular, posterior auricular or occipital adenopathy.     Cervical: No cervical adenopathy.  Skin:    General: Skin is warm and dry.     Capillary Refill: Capillary refill takes less than 2 seconds.     Coloration: Skin is not pale.     Findings: No erythema or rash.     Nails: There is no clubbing.  Neurological:     Mental Status: He is alert and oriented to person, place, and time.     GCS: GCS eye subscore is 4. GCS verbal subscore is 5. GCS motor subscore is 6.     Cranial Nerves: No cranial nerve deficit.     Sensory: No sensory deficit.     Motor: No weakness or abnormal muscle tone.     Coordination: Coordination normal.     Gait: Gait normal.     Deep Tendon Reflexes: Reflexes are normal and symmetric. Reflexes normal.  Psychiatric:        Mood and Affect: Mood normal.        Speech: Speech normal.        Behavior: Behavior normal.        Thought Content: Thought content normal.        Judgment: Judgment normal.      No  results found for any visits on 08/29/20.  Assessment & Plan     1. Muscle spasm  - CBC with Differential/Platelet - Comprehensive Metabolic Panel (CMET)  - predniSONE (STERAPRED UNI-PAK 21 TAB) 10 MG (21) TBPK tablet; PO: Take 6 tablets on day 1:Take 5 tablets day 2:Take 4 tablets day 3: Take 3 tablets day 4:Take 2 tablets day five: 5 Take 1 tablet day 6  Dispense: 21 tablet; Refill: 0 - DG Chest 2 View - DG Scapula Right - cyclobenzaprine (FLEXERIL) 10 MG tablet; Take 1 tablet (10 mg total) by mouth 3 (three) times daily as needed for muscle spasms (will cause drowsiness.).  Dispense: 30 tablet; Refill: 0  2. Pain of right scapula - Amylase -  Lipase  May take tylenol with steroid. No NSAID'S with steroid advised. He can start after prednisone dose pack.   Likely musculoskeletal pain, will get chest x ray and x ray scapula. Labs today. Also discussed symptoms of shingles and what patient should watch for call if any rash develops or other symptoms to return to the office sooner than advised.  Red Flags discussed. The patient was given clear instructions to go to ER or return to medical center if any red flags develop, symptoms do not improve, worsen or new problems develop. They verbalized understanding.  Return in about 2 weeks (around 09/12/2020), or if symptoms worsen or fail to improve, for at any time for any worsening symptoms, Go to Emergency room/ urgent care if worse.     The entirety of the information documented in the History of Present Illness, Review of Systems and Physical Exam were personally obtained by me. Portions of this information were initially documented by the CMA and reviewed by me for thoroughness and accuracy.      Jairo Ben, FNP  [facility] [phone] (phone) [phone] (fax)  [facility]

## 2020-08-30 ENCOUNTER — Ambulatory Visit: Payer: BC Managed Care – PPO | Admitting: Physician Assistant

## 2020-08-30 LAB — COMPREHENSIVE METABOLIC PANEL
ALT: 29 IU/L (ref 0–44)
AST: 22 IU/L (ref 0–40)
Albumin/Globulin Ratio: 2.5 — ABNORMAL HIGH (ref 1.2–2.2)
Albumin: 4.9 g/dL (ref 4.0–5.0)
Alkaline Phosphatase: 89 IU/L (ref 44–121)
BUN/Creatinine Ratio: 17 (ref 9–20)
BUN: 13 mg/dL (ref 6–24)
Bilirubin Total: 0.4 mg/dL (ref 0.0–1.2)
CO2: 23 mmol/L (ref 20–29)
Calcium: 9.7 mg/dL (ref 8.7–10.2)
Chloride: 104 mmol/L (ref 96–106)
Creatinine, Ser: 0.78 mg/dL (ref 0.76–1.27)
Globulin, Total: 2 g/dL (ref 1.5–4.5)
Glucose: 125 mg/dL — ABNORMAL HIGH (ref 65–99)
Potassium: 4.9 mmol/L (ref 3.5–5.2)
Sodium: 139 mmol/L (ref 134–144)
Total Protein: 6.9 g/dL (ref 6.0–8.5)
eGFR: 109 mL/min/{1.73_m2} (ref >59)

## 2020-08-30 LAB — CBC WITH DIFFERENTIAL/PLATELET
Basophils Absolute: 0 10*3/uL (ref 0.0–0.2)
Basos: 0 %
EOS (ABSOLUTE): 0.1 10*3/uL (ref 0.0–0.4)
Eos: 2 %
Hematocrit: 46.7 % (ref 37.5–51.0)
Hemoglobin: 16.3 g/dL (ref 13.0–17.7)
Immature Grans (Abs): 0.1 10*3/uL (ref 0.0–0.1)
Immature Granulocytes: 1 %
Lymphocytes Absolute: 2.8 10*3/uL (ref 0.7–3.1)
Lymphs: 31 %
MCH: 32.3 pg (ref 26.6–33.0)
MCHC: 34.9 g/dL (ref 31.5–35.7)
MCV: 93 fL (ref 79–97)
Monocytes Absolute: 0.7 10*3/uL (ref 0.1–0.9)
Monocytes: 8 %
Neutrophils Absolute: 5.5 10*3/uL (ref 1.4–7.0)
Neutrophils: 58 %
Platelets: 344 10*3/uL (ref 150–450)
RBC: 5.05 x10E6/uL (ref 4.14–5.80)
RDW: 12.5 % (ref 11.6–15.4)
WBC: 9.3 10*3/uL (ref 3.4–10.8)

## 2020-08-30 LAB — LIPASE: Lipase: 22 U/L (ref 13–78)

## 2020-08-30 LAB — AMYLASE: Amylase: 51 U/L (ref 31–110)

## 2020-08-30 NOTE — Progress Notes (Signed)
Labs within normal limits except for glucose which is ok as he was not fasting for these labs.

## 2020-10-09 ENCOUNTER — Ambulatory Visit: Payer: Self-pay | Admitting: Physician Assistant

## 2020-10-28 DIAGNOSIS — E119 Type 2 diabetes mellitus without complications: Secondary | ICD-10-CM | POA: Diagnosis not present

## 2020-10-28 LAB — HM DIABETES EYE EXAM

## 2020-11-21 ENCOUNTER — Other Ambulatory Visit: Payer: Self-pay

## 2020-11-21 DIAGNOSIS — E785 Hyperlipidemia, unspecified: Secondary | ICD-10-CM

## 2020-11-21 DIAGNOSIS — E1159 Type 2 diabetes mellitus with other circulatory complications: Secondary | ICD-10-CM

## 2020-11-21 DIAGNOSIS — I152 Hypertension secondary to endocrine disorders: Secondary | ICD-10-CM

## 2020-11-21 DIAGNOSIS — F419 Anxiety disorder, unspecified: Secondary | ICD-10-CM

## 2020-11-21 DIAGNOSIS — E1169 Type 2 diabetes mellitus with other specified complication: Secondary | ICD-10-CM

## 2020-11-21 MED ORDER — LISINOPRIL 20 MG PO TABS: 20.0000 mg | ORAL_TABLET | Freq: Every day | ORAL | 1 refills | Status: DC

## 2020-11-21 MED ORDER — SIMVASTATIN 80 MG PO TABS: ORAL_TABLET | ORAL | 1 refills | Status: DC

## 2020-11-21 MED ORDER — SERTRALINE HCL 50 MG PO TABS: 1.0000 | ORAL_TABLET | Freq: Every day | ORAL | 0 refills | Status: DC

## 2020-11-21 NOTE — Telephone Encounter (Signed)
Please review. Former Jose Barton pt. Thanks!  

## 2020-11-21 NOTE — Telephone Encounter (Signed)
[facility] is requsting refills on Simvastatin 80 mg. #90

## 2020-11-21 NOTE — Telephone Encounter (Signed)
[facility] is also requesting Sertraline 50 mg. #90

## 2020-11-21 NOTE — Telephone Encounter (Signed)
[facility] is also requesting refills on Lisinopril 20 mg. #90

## 2021-02-23 ENCOUNTER — Other Ambulatory Visit: Payer: Self-pay | Admitting: Family Medicine

## 2021-02-23 DIAGNOSIS — F419 Anxiety disorder, unspecified: Secondary | ICD-10-CM

## 2021-02-23 NOTE — Telephone Encounter (Signed)
Requested medication (s) are due for refill today: yes  Requested medication (s) are on the active medication list: yes  Last refill:  11/21/20 #90  Future visit scheduled: no  Notes to clinic:  called pt and LM on VM to call office to schedule OV   Requested Prescriptions  Pending Prescriptions Disp Refills   sertraline (ZOLOFT) 50 MG tablet [Pharmacy Med Name: SERTRALINE HCL 50 MG TABLET] 90 tablet 0    Sig: TAKE 1 TABLET BY MOUTH EVERY DAY     Psychiatry:  Antidepressants - SSRI Passed - 02/23/2021 12:52 AM      Passed - Valid encounter within last 6 months    Recent Outpatient Visits           5 months ago Muscle spasm   [facility] [place], Eula Fried, FNP   10 months ago Annual physical exam   [facility], Lavella [place]ock, [place]   1 year ago Anxiety   [facility] Osvaldo Angst M, [place]   1 year ago Controlled type 2 diabetes mellitus without complication, without long-term current use of insulin [facility])   [facility], [place], [place]   1 year ago Hypertension associated with diabetes [facility])   [facility] [place], [place], [place]

## 2021-03-12 ENCOUNTER — Other Ambulatory Visit: Payer: Self-pay | Admitting: Family Medicine

## 2021-03-12 DIAGNOSIS — F419 Anxiety disorder, unspecified: Secondary | ICD-10-CM

## 2021-07-03 DIAGNOSIS — E119 Type 2 diabetes mellitus without complications: Secondary | ICD-10-CM | POA: Diagnosis not present

## 2021-07-03 DIAGNOSIS — H524 Presbyopia: Secondary | ICD-10-CM | POA: Diagnosis not present

## 2021-07-11 DIAGNOSIS — Z0000 Encounter for general adult medical examination without abnormal findings: Secondary | ICD-10-CM | POA: Diagnosis not present

## 2021-07-11 DIAGNOSIS — F17210 Nicotine dependence, cigarettes, uncomplicated: Secondary | ICD-10-CM | POA: Diagnosis not present

## 2021-07-11 DIAGNOSIS — Z1211 Encounter for screening for malignant neoplasm of colon: Secondary | ICD-10-CM | POA: Diagnosis not present

## 2021-07-11 DIAGNOSIS — Z13220 Encounter for screening for lipoid disorders: Secondary | ICD-10-CM | POA: Diagnosis not present

## 2021-07-11 DIAGNOSIS — Z130 Encounter for screening for diseases of the blood and blood-forming organs and certain disorders involving the immune mechanism: Secondary | ICD-10-CM | POA: Diagnosis not present

## 2021-10-16 DIAGNOSIS — D122 Benign neoplasm of ascending colon: Secondary | ICD-10-CM | POA: Diagnosis not present

## 2021-10-16 DIAGNOSIS — E119 Type 2 diabetes mellitus without complications: Secondary | ICD-10-CM | POA: Diagnosis not present

## 2021-10-16 DIAGNOSIS — Z1211 Encounter for screening for malignant neoplasm of colon: Secondary | ICD-10-CM | POA: Diagnosis not present

## 2021-10-16 DIAGNOSIS — D128 Benign neoplasm of rectum: Secondary | ICD-10-CM | POA: Diagnosis not present

## 2021-10-16 DIAGNOSIS — D125 Benign neoplasm of sigmoid colon: Secondary | ICD-10-CM | POA: Diagnosis not present

## 2022-03-11 DIAGNOSIS — S0502XA Injury of conjunctiva and corneal abrasion without foreign body, left eye, initial encounter: Secondary | ICD-10-CM | POA: Diagnosis not present

## 2022-03-12 DIAGNOSIS — S0502XD Injury of conjunctiva and corneal abrasion without foreign body, left eye, subsequent encounter: Secondary | ICD-10-CM | POA: Diagnosis not present

## 2022-06-30 DIAGNOSIS — M5136 Other intervertebral disc degeneration, lumbar region: Secondary | ICD-10-CM | POA: Diagnosis not present

## 2022-06-30 DIAGNOSIS — M5116 Intervertebral disc disorders with radiculopathy, lumbar region: Secondary | ICD-10-CM | POA: Diagnosis not present

## 2022-06-30 DIAGNOSIS — M9903 Segmental and somatic dysfunction of lumbar region: Secondary | ICD-10-CM | POA: Diagnosis not present

## 2022-06-30 DIAGNOSIS — M5137 Other intervertebral disc degeneration, lumbosacral region: Secondary | ICD-10-CM | POA: Diagnosis not present

## 2022-07-01 DIAGNOSIS — M9903 Segmental and somatic dysfunction of lumbar region: Secondary | ICD-10-CM | POA: Diagnosis not present

## 2022-07-01 DIAGNOSIS — M5116 Intervertebral disc disorders with radiculopathy, lumbar region: Secondary | ICD-10-CM | POA: Diagnosis not present

## 2022-07-01 DIAGNOSIS — M5137 Other intervertebral disc degeneration, lumbosacral region: Secondary | ICD-10-CM | POA: Diagnosis not present

## 2022-07-01 DIAGNOSIS — M5136 Other intervertebral disc degeneration, lumbar region: Secondary | ICD-10-CM | POA: Diagnosis not present

## 2022-07-06 DIAGNOSIS — M5136 Other intervertebral disc degeneration, lumbar region: Secondary | ICD-10-CM | POA: Diagnosis not present

## 2022-07-06 DIAGNOSIS — M5116 Intervertebral disc disorders with radiculopathy, lumbar region: Secondary | ICD-10-CM | POA: Diagnosis not present

## 2022-07-06 DIAGNOSIS — M5137 Other intervertebral disc degeneration, lumbosacral region: Secondary | ICD-10-CM | POA: Diagnosis not present

## 2022-07-06 DIAGNOSIS — M9903 Segmental and somatic dysfunction of lumbar region: Secondary | ICD-10-CM | POA: Diagnosis not present

## 2022-07-07 DIAGNOSIS — M5137 Other intervertebral disc degeneration, lumbosacral region: Secondary | ICD-10-CM | POA: Diagnosis not present

## 2022-07-07 DIAGNOSIS — M9903 Segmental and somatic dysfunction of lumbar region: Secondary | ICD-10-CM | POA: Diagnosis not present

## 2022-07-07 DIAGNOSIS — M5136 Other intervertebral disc degeneration, lumbar region: Secondary | ICD-10-CM | POA: Diagnosis not present

## 2022-07-07 DIAGNOSIS — M5116 Intervertebral disc disorders with radiculopathy, lumbar region: Secondary | ICD-10-CM | POA: Diagnosis not present

## 2022-07-09 DIAGNOSIS — M5116 Intervertebral disc disorders with radiculopathy, lumbar region: Secondary | ICD-10-CM | POA: Diagnosis not present

## 2022-07-09 DIAGNOSIS — M9903 Segmental and somatic dysfunction of lumbar region: Secondary | ICD-10-CM | POA: Diagnosis not present

## 2022-07-09 DIAGNOSIS — M5137 Other intervertebral disc degeneration, lumbosacral region: Secondary | ICD-10-CM | POA: Diagnosis not present

## 2022-07-09 DIAGNOSIS — M5136 Other intervertebral disc degeneration, lumbar region: Secondary | ICD-10-CM | POA: Diagnosis not present

## 2022-07-20 DIAGNOSIS — M545 Low back pain, unspecified: Secondary | ICD-10-CM | POA: Diagnosis not present

## 2022-07-25 DIAGNOSIS — M545 Low back pain, unspecified: Secondary | ICD-10-CM | POA: Diagnosis not present

## 2022-07-28 DIAGNOSIS — M545 Low back pain, unspecified: Secondary | ICD-10-CM | POA: Diagnosis not present

## 2022-07-30 DIAGNOSIS — M5416 Radiculopathy, lumbar region: Secondary | ICD-10-CM | POA: Diagnosis not present

## 2022-08-13 DIAGNOSIS — Z6828 Body mass index (BMI) 28.0-28.9, adult: Secondary | ICD-10-CM | POA: Diagnosis not present

## 2022-08-13 DIAGNOSIS — M5126 Other intervertebral disc displacement, lumbar region: Secondary | ICD-10-CM | POA: Diagnosis not present

## 2022-08-27 DIAGNOSIS — M545 Low back pain, unspecified: Secondary | ICD-10-CM | POA: Diagnosis not present

## 2022-08-27 DIAGNOSIS — E119 Type 2 diabetes mellitus without complications: Secondary | ICD-10-CM | POA: Diagnosis not present

## 2022-08-27 DIAGNOSIS — M5116 Intervertebral disc disorders with radiculopathy, lumbar region: Secondary | ICD-10-CM | POA: Diagnosis not present

## 2022-08-28 HISTORY — PX: HERNIA REPAIR: SHX51

## 2022-10-27 DIAGNOSIS — M5416 Radiculopathy, lumbar region: Secondary | ICD-10-CM | POA: Diagnosis not present

## 2022-11-04 DIAGNOSIS — Z1330 Encounter for screening examination for mental health and behavioral disorders, unspecified: Secondary | ICD-10-CM | POA: Diagnosis not present

## 2022-11-04 DIAGNOSIS — E1169 Type 2 diabetes mellitus with other specified complication: Secondary | ICD-10-CM | POA: Diagnosis not present

## 2022-11-04 DIAGNOSIS — Z23 Encounter for immunization: Secondary | ICD-10-CM | POA: Diagnosis not present

## 2022-11-04 DIAGNOSIS — E1159 Type 2 diabetes mellitus with other circulatory complications: Secondary | ICD-10-CM | POA: Diagnosis not present

## 2022-11-04 DIAGNOSIS — I152 Hypertension secondary to endocrine disorders: Secondary | ICD-10-CM | POA: Diagnosis not present

## 2022-11-04 DIAGNOSIS — Z0000 Encounter for general adult medical examination without abnormal findings: Secondary | ICD-10-CM | POA: Diagnosis not present

## 2022-11-04 DIAGNOSIS — E119 Type 2 diabetes mellitus without complications: Secondary | ICD-10-CM | POA: Diagnosis not present

## 2022-11-04 DIAGNOSIS — E785 Hyperlipidemia, unspecified: Secondary | ICD-10-CM | POA: Diagnosis not present

## 2022-11-04 DIAGNOSIS — M5136 Other intervertebral disc degeneration, lumbar region: Secondary | ICD-10-CM | POA: Diagnosis not present

## 2022-11-10 DIAGNOSIS — M5416 Radiculopathy, lumbar region: Secondary | ICD-10-CM | POA: Diagnosis not present

## 2022-11-10 DIAGNOSIS — M545 Low back pain, unspecified: Secondary | ICD-10-CM | POA: Diagnosis not present

## 2022-11-26 DIAGNOSIS — M5416 Radiculopathy, lumbar region: Secondary | ICD-10-CM | POA: Diagnosis not present

## 2022-11-26 DIAGNOSIS — Z6829 Body mass index (BMI) 29.0-29.9, adult: Secondary | ICD-10-CM | POA: Diagnosis not present

## 2022-12-08 DIAGNOSIS — M5416 Radiculopathy, lumbar region: Secondary | ICD-10-CM | POA: Diagnosis not present

## 2022-12-08 DIAGNOSIS — M5116 Intervertebral disc disorders with radiculopathy, lumbar region: Secondary | ICD-10-CM | POA: Diagnosis not present

## 2022-12-11 DIAGNOSIS — M5126 Other intervertebral disc displacement, lumbar region: Secondary | ICD-10-CM | POA: Diagnosis not present

## 2022-12-11 DIAGNOSIS — M5416 Radiculopathy, lumbar region: Secondary | ICD-10-CM | POA: Diagnosis not present

## 2023-01-11 DIAGNOSIS — M5416 Radiculopathy, lumbar region: Secondary | ICD-10-CM | POA: Diagnosis not present

## 2023-01-19 DIAGNOSIS — M5416 Radiculopathy, lumbar region: Secondary | ICD-10-CM | POA: Diagnosis not present

## 2023-01-19 DIAGNOSIS — M5116 Intervertebral disc disorders with radiculopathy, lumbar region: Secondary | ICD-10-CM | POA: Diagnosis not present

## 2023-02-09 DIAGNOSIS — Z6827 Body mass index (BMI) 27.0-27.9, adult: Secondary | ICD-10-CM | POA: Diagnosis not present

## 2023-02-09 DIAGNOSIS — M5126 Other intervertebral disc displacement, lumbar region: Secondary | ICD-10-CM | POA: Diagnosis not present

## 2023-02-09 DIAGNOSIS — M160 Bilateral primary osteoarthritis of hip: Secondary | ICD-10-CM | POA: Diagnosis not present

## 2023-02-09 DIAGNOSIS — M25551 Pain in right hip: Secondary | ICD-10-CM | POA: Diagnosis not present

## 2023-02-09 DIAGNOSIS — M5416 Radiculopathy, lumbar region: Secondary | ICD-10-CM | POA: Diagnosis not present

## 2023-02-13 DIAGNOSIS — M25551 Pain in right hip: Secondary | ICD-10-CM | POA: Diagnosis not present

## 2023-02-13 DIAGNOSIS — M545 Low back pain, unspecified: Secondary | ICD-10-CM | POA: Diagnosis not present

## 2023-02-13 DIAGNOSIS — R269 Unspecified abnormalities of gait and mobility: Secondary | ICD-10-CM | POA: Diagnosis not present

## 2023-02-19 IMAGING — CR DG SCAPULA*R*
1 series · 2 of 2 positions shown · non-contrast
Comparison: None.

CLINICAL DATA: Right scapular pain

EXAM:
RIGHT SCAPULA - 2+ VIEWS

[Series 1: dg scapula right · 0.14mm/px · 2 of 2 slices shown]
[im 1/2]
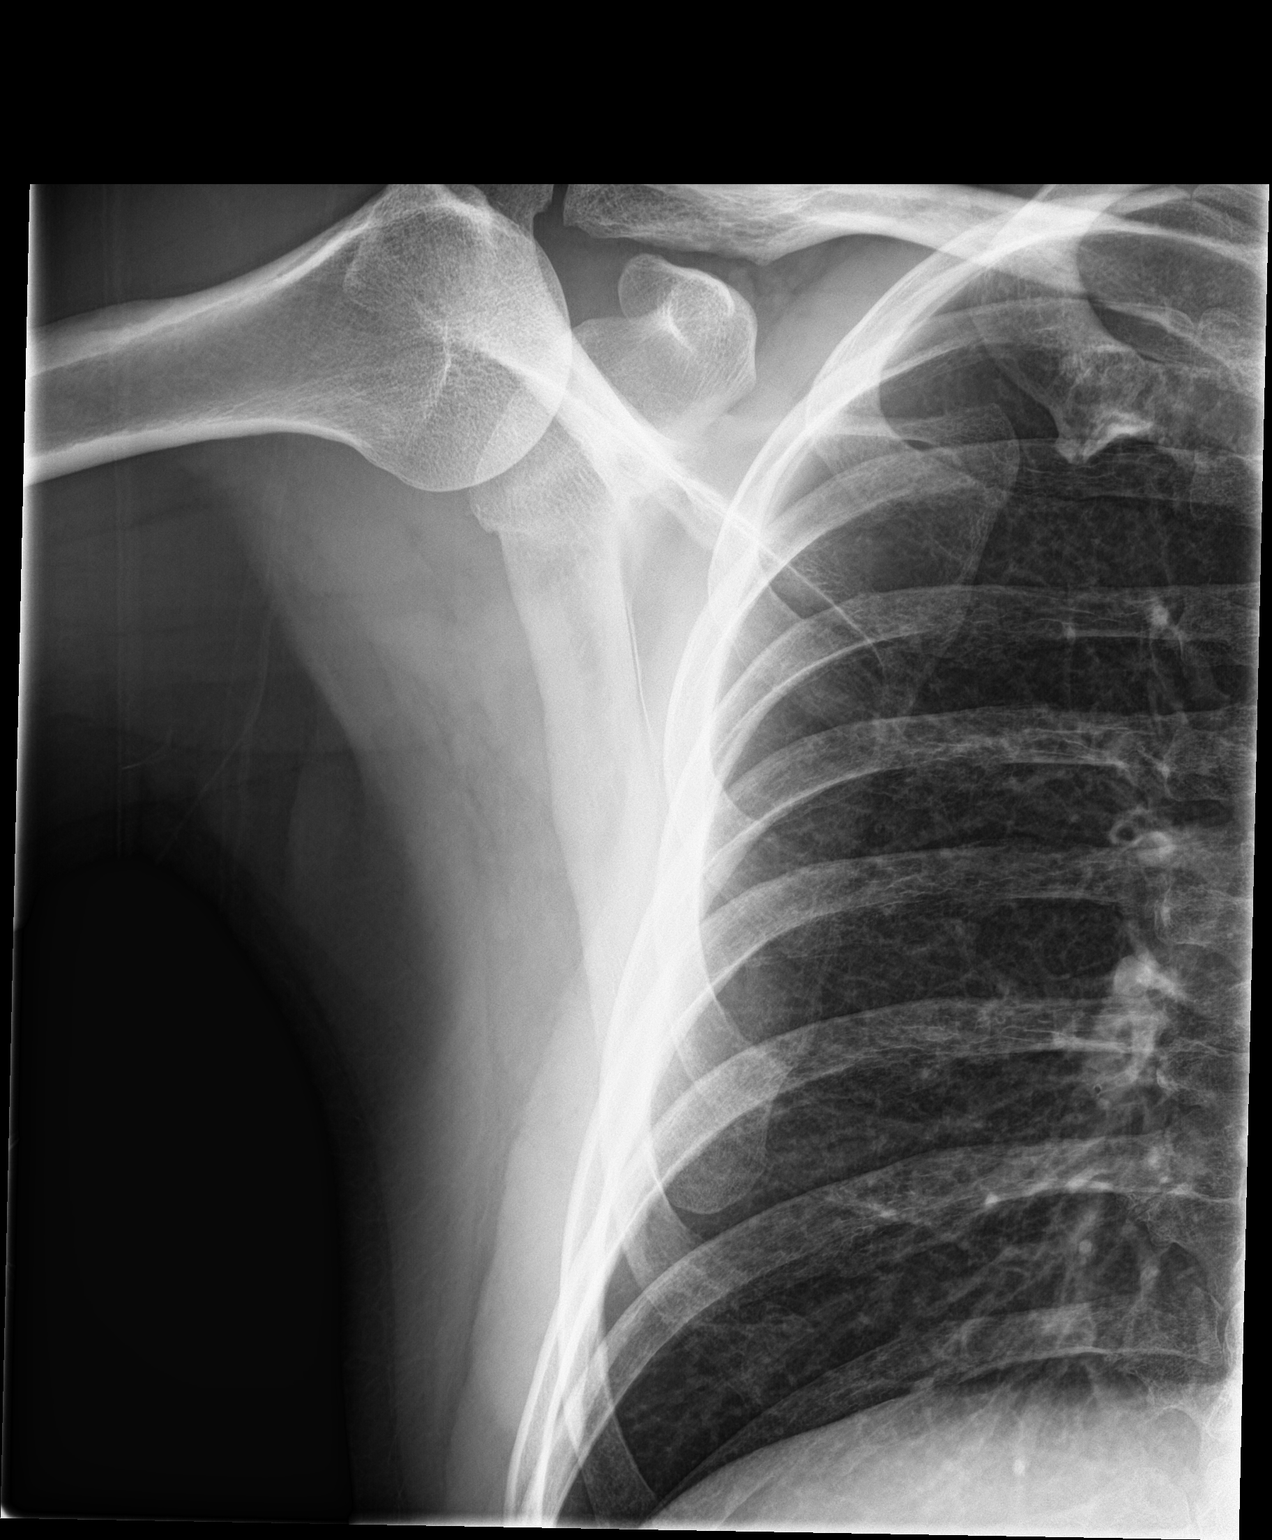
[im 2/2]
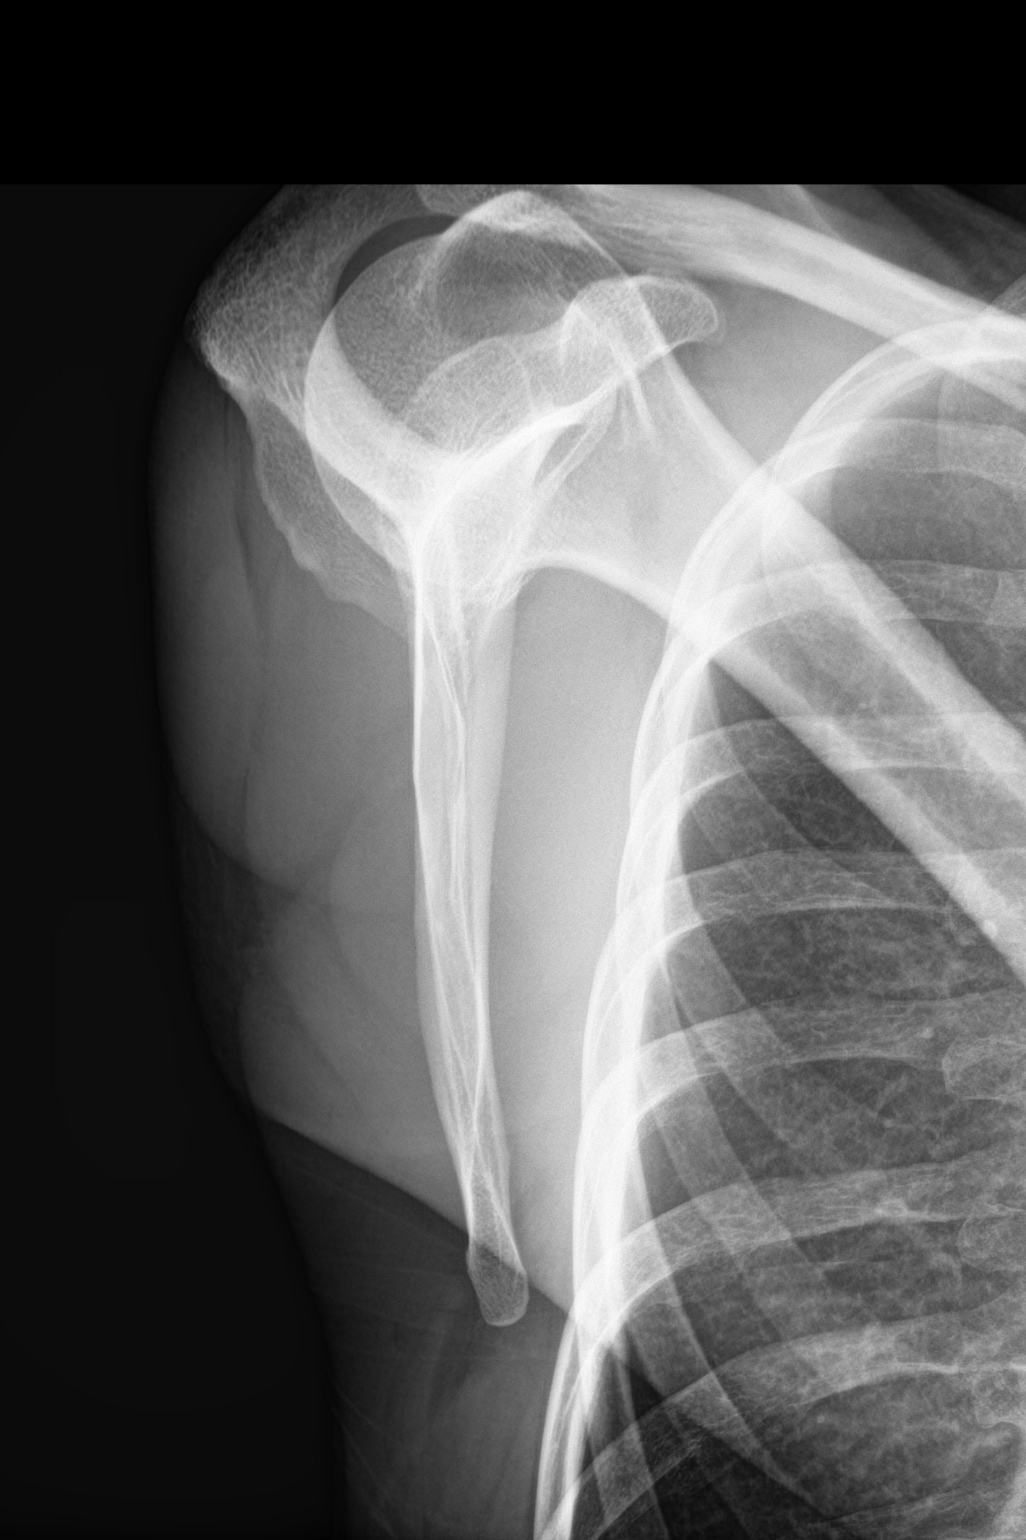

[2 of 2 positions shown; findings below may reference images not displayed]

FINDINGS: There is no evidence of fracture or other focal bone lesions. Soft
tissues are unremarkable.
IMPRESSION: Negative.

## 2023-02-26 DIAGNOSIS — M545 Low back pain, unspecified: Secondary | ICD-10-CM | POA: Diagnosis not present

## 2023-02-26 DIAGNOSIS — R269 Unspecified abnormalities of gait and mobility: Secondary | ICD-10-CM | POA: Diagnosis not present

## 2023-02-26 DIAGNOSIS — M25551 Pain in right hip: Secondary | ICD-10-CM | POA: Diagnosis not present

## 2023-02-27 DIAGNOSIS — M545 Low back pain, unspecified: Secondary | ICD-10-CM | POA: Diagnosis not present

## 2023-02-27 DIAGNOSIS — R269 Unspecified abnormalities of gait and mobility: Secondary | ICD-10-CM | POA: Diagnosis not present

## 2023-02-27 DIAGNOSIS — M25551 Pain in right hip: Secondary | ICD-10-CM | POA: Diagnosis not present

## 2023-03-01 DIAGNOSIS — G8929 Other chronic pain: Secondary | ICD-10-CM | POA: Diagnosis not present

## 2023-03-01 DIAGNOSIS — R269 Unspecified abnormalities of gait and mobility: Secondary | ICD-10-CM | POA: Diagnosis not present

## 2023-03-01 DIAGNOSIS — M5126 Other intervertebral disc displacement, lumbar region: Secondary | ICD-10-CM | POA: Diagnosis not present

## 2023-03-01 DIAGNOSIS — M545 Low back pain, unspecified: Secondary | ICD-10-CM | POA: Diagnosis not present

## 2023-03-01 DIAGNOSIS — Z6827 Body mass index (BMI) 27.0-27.9, adult: Secondary | ICD-10-CM | POA: Diagnosis not present

## 2023-03-01 DIAGNOSIS — M25551 Pain in right hip: Secondary | ICD-10-CM | POA: Diagnosis not present

## 2023-03-10 DIAGNOSIS — M48061 Spinal stenosis, lumbar region without neurogenic claudication: Secondary | ICD-10-CM | POA: Diagnosis not present

## 2023-03-10 DIAGNOSIS — R202 Paresthesia of skin: Secondary | ICD-10-CM | POA: Diagnosis not present

## 2023-03-10 DIAGNOSIS — M5126 Other intervertebral disc displacement, lumbar region: Secondary | ICD-10-CM | POA: Diagnosis not present

## 2023-03-10 DIAGNOSIS — M79604 Pain in right leg: Secondary | ICD-10-CM | POA: Diagnosis not present

## 2023-03-10 DIAGNOSIS — R531 Weakness: Secondary | ICD-10-CM | POA: Diagnosis not present

## 2023-03-10 DIAGNOSIS — M5137 Other intervertebral disc degeneration, lumbosacral region: Secondary | ICD-10-CM | POA: Diagnosis not present

## 2023-03-10 DIAGNOSIS — M25551 Pain in right hip: Secondary | ICD-10-CM | POA: Diagnosis not present

## 2023-03-18 DIAGNOSIS — M5416 Radiculopathy, lumbar region: Secondary | ICD-10-CM | POA: Diagnosis not present

## 2023-03-22 DIAGNOSIS — R269 Unspecified abnormalities of gait and mobility: Secondary | ICD-10-CM | POA: Diagnosis not present

## 2023-03-22 DIAGNOSIS — M545 Low back pain, unspecified: Secondary | ICD-10-CM | POA: Diagnosis not present

## 2023-03-22 DIAGNOSIS — M25551 Pain in right hip: Secondary | ICD-10-CM | POA: Diagnosis not present

## 2023-03-26 DIAGNOSIS — R269 Unspecified abnormalities of gait and mobility: Secondary | ICD-10-CM | POA: Diagnosis not present

## 2023-03-26 DIAGNOSIS — M545 Low back pain, unspecified: Secondary | ICD-10-CM | POA: Diagnosis not present

## 2023-03-26 DIAGNOSIS — M25551 Pain in right hip: Secondary | ICD-10-CM | POA: Diagnosis not present

## 2023-03-29 DIAGNOSIS — M25551 Pain in right hip: Secondary | ICD-10-CM | POA: Diagnosis not present

## 2023-03-29 DIAGNOSIS — R269 Unspecified abnormalities of gait and mobility: Secondary | ICD-10-CM | POA: Diagnosis not present

## 2023-03-29 DIAGNOSIS — M545 Low back pain, unspecified: Secondary | ICD-10-CM | POA: Diagnosis not present

## 2023-04-12 DIAGNOSIS — M48061 Spinal stenosis, lumbar region without neurogenic claudication: Secondary | ICD-10-CM | POA: Diagnosis not present

## 2023-04-12 DIAGNOSIS — M5416 Radiculopathy, lumbar region: Secondary | ICD-10-CM | POA: Diagnosis not present

## 2023-04-12 DIAGNOSIS — M5116 Intervertebral disc disorders with radiculopathy, lumbar region: Secondary | ICD-10-CM | POA: Diagnosis not present

## 2023-04-28 DIAGNOSIS — M5416 Radiculopathy, lumbar region: Secondary | ICD-10-CM | POA: Diagnosis not present

## 2023-06-14 DIAGNOSIS — M5416 Radiculopathy, lumbar region: Secondary | ICD-10-CM | POA: Diagnosis not present

## 2023-07-07 NOTE — Progress Notes (Unsigned)
[facility] at [facility] [address], [address] [place], [place] [address] [phone]  Date:  07/08/2023   Name:  Jose Barton   DOB:  December 03, 1970   MRN:  [id number]  PCP:  Trey Sailors, PA-C    Chief Complaint: No chief complaint on file.   History of Present Illness:  Jose Barton is a 53 y.o. very pleasant male patient who presents with the following:  Patient seen today to establish care-history of hyperlipidemia, hypertension, diabetes  Patient Active Problem List   Diagnosis Date Noted   Pain of right scapula 08/29/2020   Muscle spasm 08/29/2020   Hyperlipidemia associated with type 2 diabetes mellitus (HCC) 03/18/2018   Hypertension associated with diabetes (HCC) 08/13/2017   Alcohol abuse 07/13/2017   Tobacco abuse 07/13/2017   Anxiety 07/13/2017   Controlled type 2 diabetes mellitus without complication, without long-term current use of insulin (HCC) 07/13/2017   Severe diabetic hypoglycemia (HCC) 08/01/2014    Past Medical History:  Diagnosis Date   Anxiety    Diabetes mellitus without complication (HCC)    Hypertension associated with diabetes (HCC) 08/13/2017   Pulmonary edema     Past Surgical History:  Procedure Laterality Date   NO PAST SURGERIES      Social History   Tobacco Use   Smoking status: Every Day    Current packs/day: 0.50    Average packs/day: 0.5 packs/day for 30.0 years (15.0 ttl pk-yrs)    Types: Cigarettes   Smokeless tobacco: Never  Vaping Use   Vaping status: Never Used  Substance Use Topics   Alcohol use: Yes    Alcohol/week: 24.0 standard drinks of alcohol    Types: 24 Cans of beer per week    Comment: daily   Drug use: No    Family History  Problem Relation Age of Onset   Lung cancer Mother    Heart attack Father    Diabetes Father    Hypertension Father    Breast cancer Sister     Allergies  Allergen Reactions   Codeine Hives   Percocet  [Oxycodone-Acetaminophen] Hives   Vicodin [Hydrocodone-Acetaminophen] Hives   Bupropion Swelling and Hives    Medication list has been reviewed and updated.  Current Outpatient Medications on File Prior to Visit  Medication Sig Dispense Refill   clindamycin (CLEOCIN-T) 1 % external solution Apply topically 2 (two) times daily. (Patient not taking: No sig reported) 30 mL 0   cyclobenzaprine (FLEXERIL) 10 MG tablet Take 1 tablet (10 mg total) by mouth 3 (three) times daily as needed for muscle spasms (will cause drowsiness.). 30 tablet 0   lisinopril (ZESTRIL) 20 MG tablet Take 1 tablet (20 mg total) by mouth daily. 90 tablet 1   predniSONE (STERAPRED UNI-PAK 21 TAB) 10 MG (21) TBPK tablet PO: Take 6 tablets on day 1:Take 5 tablets day 2:Take 4 tablets day 3: Take 3 tablets day 4:Take 2 tablets day five: 5 Take 1 tablet day 6 21 tablet 0   sertraline (ZOLOFT) 50 MG tablet TAKE 1 TABLET (50 MG TOTAL) BY MOUTH DAILY. PLEASE SCHEDULE AN OFFICE VISIT BEFORE ANYMORE REFILLS. 90 tablet 0   simvastatin (ZOCOR) 80 MG tablet TAKE 1 TABLET BY MOUTH EVERYDAY AT BEDTIME 90 tablet 1   No current facility-administered medications on file prior to visit.    Review of Systems:  As per HPI- otherwise negative.   Physical Examination: There were no vitals filed  for this visit. There were no vitals filed for this visit. There is no height or weight on file to calculate BMI. Ideal Body Weight:    GEN: no acute distress. HEENT: Atraumatic, Normocephalic.  Ears and Nose: No external deformity. CV: RRR, No M/G/R. No JVD. No thrill. No extra heart sounds. PULM: CTA B, no wheezes, crackles, rhonchi. No retractions. No resp. distress. No accessory muscle use. ABD: S, NT, ND, +BS. No rebound. No HSM. EXTR: No c/c/e PSYCH: Normally interactive. Conversant.    Assessment and Plan: ***  Signed Abbe Amsterdam, MD

## 2023-07-08 ENCOUNTER — Encounter (HOSPITAL_BASED_OUTPATIENT_CLINIC_OR_DEPARTMENT_OTHER): Payer: Self-pay | Admitting: *Deleted

## 2023-07-08 ENCOUNTER — Encounter: Payer: Self-pay | Admitting: Family Medicine

## 2023-07-08 ENCOUNTER — Ambulatory Visit: Payer: BC Managed Care – PPO | Admitting: Family Medicine

## 2023-07-08 ENCOUNTER — Emergency Department (HOSPITAL_BASED_OUTPATIENT_CLINIC_OR_DEPARTMENT_OTHER): Payer: BC Managed Care – PPO

## 2023-07-08 ENCOUNTER — Emergency Department (HOSPITAL_BASED_OUTPATIENT_CLINIC_OR_DEPARTMENT_OTHER)
Admission: EM | Admit: 2023-07-08 | Discharge: 2023-07-08 | Discharge status: Against Medical Advice | Payer: BC Managed Care – PPO | Attending: Emergency Medicine | Admitting: Emergency Medicine

## 2023-07-08 ENCOUNTER — Other Ambulatory Visit: Payer: Self-pay

## 2023-07-08 VITALS — BP 150/92 | HR 118 | Temp 97.8°F | Resp 18 | Ht 68.0 in | Wt 182.8 lb

## 2023-07-08 DIAGNOSIS — Z5321 Procedure and treatment not carried out due to patient leaving prior to being seen by health care provider: Secondary | ICD-10-CM | POA: Diagnosis not present

## 2023-07-08 DIAGNOSIS — R0789 Other chest pain: Secondary | ICD-10-CM

## 2023-07-08 DIAGNOSIS — I700 Atherosclerosis of aorta: Secondary | ICD-10-CM | POA: Diagnosis not present

## 2023-07-08 LAB — CBC
HCT: 44.5 % (ref 39.0–52.0)
Hemoglobin: 15.5 g/dL (ref 13.0–17.0)
MCH: 30.6 pg (ref 26.0–34.0)
MCHC: 34.8 g/dL (ref 30.0–36.0)
MCV: 87.9 fL (ref 80.0–100.0)
Platelets: 301 10*3/uL (ref 150–400)
RBC: 5.06 MIL/uL (ref 4.22–5.81)
RDW: 12 % (ref 11.5–15.5)
WBC: 8.6 10*3/uL (ref 4.0–10.5)
nRBC: 0 % (ref 0.0–0.2)

## 2023-07-08 LAB — BASIC METABOLIC PANEL
Anion gap: 11 (ref 5–15)
BUN: 20 mg/dL (ref 6–20)
CO2: 23 mmol/L (ref 22–32)
Calcium: 9.5 mg/dL (ref 8.9–10.3)
Chloride: 100 mmol/L (ref 98–111)
Creatinine, Ser: 0.69 mg/dL (ref 0.61–1.24)
GFR, Estimated: >60 mL/min (ref >60)
Glucose, Bld: 235 mg/dL — ABNORMAL HIGH (ref 70–99)
Potassium: 3.7 mmol/L (ref 3.5–5.1)
Sodium: 134 mmol/L — ABNORMAL LOW (ref 135–145)

## 2023-07-08 LAB — TROPONIN I (HIGH SENSITIVITY): Troponin I (High Sensitivity): <2 ng/L (ref <18)

## 2023-07-08 NOTE — ED Triage Notes (Signed)
Pt here due to chest pressure x 4weeks.  Pt was seen by PCP and she did EKG which was WNL and she wanted him to come to ED for further follow up

## 2023-07-08 NOTE — Patient Instructions (Signed)
It was great to meet you today- please go to the ER to make sure all is well, and then we will visit again next week to get started with your care!

## 2023-07-08 NOTE — ED Notes (Signed)
Pt ready to leave. Registration notified this nurse. This nurse spoke with pt. Pt is no longer feeling bad and is ready to go. Is following up with PCP on Monday;.

## 2023-07-10 NOTE — Patient Instructions (Incomplete)
It was great to see you today, I will be in touch with your lab work Please start on sertraline 50 mg daily and hydroxyzine as needed for anxiety attack or insomnia We might consider either a treadmill test or a CT of your heart.  Please keep me posted as to your symptoms

## 2023-07-10 NOTE — Progress Notes (Unsigned)
[facility] at [facility] [address], [address] [place], [place] [address] [phone]  Date:  07/12/2023   Name:  Jose Barton   DOB:  08-01-1970   MRN:  [id number]  PCP:  Pearline Cables, MD    Chief Complaint: No chief complaint on file.   History of Present Illness:  Jose Barton is a 53 y.o. very pleasant male patient who presents with the following:  Patient seen today for follow-up.  We saw each other to establish care last week, but at that time he was having active chest pain and was referred to the ER.  He was evaluated and released to home History of HTN, high lipids, DM for 10 years or longer He was dx with "type 2" DM at age 19- he has never been on insulin  Dr Danielle Dess did some spine surgery in October 2024- spinal fusion L4/5 Pt reports his A1c was in the "low 6s" at that time  Married to my patient Maralyn Sago, they have an infant daughter.  At her last visit Dionne admitted to being under a lot of stress.  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, CBC, troponin  Patient Active Problem List   Diagnosis Date Noted   Pain of right scapula 08/29/2020   Muscle spasm 08/29/2020   Hyperlipidemia associated with type 2 diabetes mellitus (HCC) 03/18/2018   Hypertension associated with diabetes (HCC) 08/13/2017   Alcohol abuse 07/13/2017   Tobacco abuse 07/13/2017   Anxiety 07/13/2017   Controlled type 2 diabetes mellitus without complication, without long-term current use of insulin (HCC) 07/13/2017   Severe diabetic hypoglycemia (HCC) 08/01/2014    Past Medical History:  Diagnosis Date   Anxiety    Diabetes mellitus without complication (HCC)    Hypertension associated with diabetes (HCC) 08/13/2017   Pulmonary edema     Past Surgical History:  Procedure Laterality Date   HERNIA REPAIR  08/28/2022   Dr Danielle Dess    Social History    Tobacco Use   Smoking status: Every Day    Current packs/day: 0.50    Average packs/day: 0.5 packs/day for 30.0 years (15.0 ttl pk-yrs)    Types: Cigarettes   Smokeless tobacco: Never  Vaping Use   Vaping status: Never Used  Substance Use Topics   Alcohol use: Yes    Alcohol/week: 24.0 standard drinks of alcohol    Types: 24 Cans of beer per week    Comment: daily   Drug use: No    Family History  Problem Relation Age of Onset   Lung cancer Mother    Heart attack Father    Diabetes Father    Hypertension Father    Breast cancer Sister     Allergies  Allergen Reactions   Codeine Hives   Percocet [Oxycodone-Acetaminophen] Hives   Vicodin [Hydrocodone-Acetaminophen] Hives   Bupropion Swelling and Hives    Medication list has been reviewed and updated.  No current outpatient medications on file prior to visit.   No current facility-administered medications on file prior to visit.    Review of Systems:  As per HPI- otherwise negative.   Physical Examination: There were no vitals filed for this visit. There were no vitals filed for this visit. There is no height or weight on file to calculate BMI. Ideal Body Weight:    GEN: no  acute distress. HEENT: Atraumatic, Normocephalic.  Ears and Nose: No external deformity. CV: RRR, No M/G/R. No JVD. No thrill. No extra heart sounds. PULM: CTA B, no wheezes, crackles, rhonchi. No retractions. No resp. distress. No accessory muscle use. ABD: S, NT, ND, +BS. No rebound. No HSM. EXTR: No c/c/e PSYCH: Normally interactive. Conversant.    Assessment and Plan: ***  Signed Abbe Amsterdam, MD

## 2023-07-12 ENCOUNTER — Ambulatory Visit: Payer: BC Managed Care – PPO | Admitting: Family Medicine

## 2023-07-12 ENCOUNTER — Encounter: Payer: Self-pay | Admitting: Family Medicine

## 2023-07-12 VITALS — BP 136/80 | HR 97 | Temp 97.7°F | Resp 18

## 2023-07-12 DIAGNOSIS — E785 Hyperlipidemia, unspecified: Secondary | ICD-10-CM | POA: Diagnosis not present

## 2023-07-12 DIAGNOSIS — Z125 Encounter for screening for malignant neoplasm of prostate: Secondary | ICD-10-CM | POA: Diagnosis not present

## 2023-07-12 DIAGNOSIS — E1169 Type 2 diabetes mellitus with other specified complication: Secondary | ICD-10-CM | POA: Diagnosis not present

## 2023-07-12 DIAGNOSIS — R0789 Other chest pain: Secondary | ICD-10-CM

## 2023-07-12 DIAGNOSIS — I152 Hypertension secondary to endocrine disorders: Secondary | ICD-10-CM | POA: Diagnosis not present

## 2023-07-12 DIAGNOSIS — F4322 Adjustment disorder with anxiety: Secondary | ICD-10-CM

## 2023-07-12 DIAGNOSIS — E1159 Type 2 diabetes mellitus with other circulatory complications: Secondary | ICD-10-CM | POA: Diagnosis not present

## 2023-07-12 DIAGNOSIS — E119 Type 2 diabetes mellitus without complications: Secondary | ICD-10-CM

## 2023-07-12 LAB — LIPID PANEL
Cholesterol: 287 mg/dL — ABNORMAL HIGH (ref 0–200)
HDL: 49.7 mg/dL (ref >39.00)
LDL Cholesterol: 181 mg/dL — ABNORMAL HIGH (ref 0–99)
NonHDL: 236.86
Total CHOL/HDL Ratio: 6
Triglycerides: 278 mg/dL — ABNORMAL HIGH (ref 0.0–149.0)
VLDL: 55.6 mg/dL — ABNORMAL HIGH (ref 0.0–40.0)

## 2023-07-12 LAB — HEPATIC FUNCTION PANEL
ALT: 39 U/L (ref 0–53)
AST: 21 U/L (ref 0–37)
Albumin: 4.9 g/dL (ref 3.5–5.2)
Alkaline Phosphatase: 76 U/L (ref 39–117)
Bilirubin, Direct: 0.1 mg/dL (ref 0.0–0.3)
Total Bilirubin: 0.7 mg/dL (ref 0.2–1.2)
Total Protein: 7.1 g/dL (ref 6.0–8.3)

## 2023-07-12 LAB — HEMOGLOBIN A1C: Hgb A1c MFr Bld: 6.7 % — ABNORMAL HIGH (ref 4.6–6.5)

## 2023-07-12 LAB — MICROALBUMIN / CREATININE URINE RATIO
Creatinine,U: 143.5 mg/dL
Microalb Creat Ratio: 3.5 mg/g (ref 0.0–30.0)
Microalb, Ur: 5 mg/dL — ABNORMAL HIGH (ref 0.0–1.9)

## 2023-07-12 LAB — TSH: TSH: 1.35 u[IU]/mL (ref 0.35–5.50)

## 2023-07-12 LAB — PSA: PSA: 1.91 ng/mL (ref 0.10–4.00)

## 2023-07-12 MED ORDER — SERTRALINE HCL 50 MG PO TABS: 50.0000 mg | ORAL_TABLET | Freq: Every day | ORAL | 3 refills | Status: DC

## 2023-07-12 MED ORDER — HYDROXYZINE PAMOATE 25 MG PO CAPS: 25.0000 mg | ORAL_CAPSULE | Freq: Three times a day (TID) | ORAL | 1 refills | Status: AC | PRN

## 2023-07-13 ENCOUNTER — Encounter: Payer: Self-pay | Admitting: Family Medicine

## 2023-07-13 LAB — D-DIMER, QUANTITATIVE: D-Dimer, Quant: 0.22 ug{FEU}/mL (ref <0.50)

## 2023-08-03 ENCOUNTER — Other Ambulatory Visit: Payer: Self-pay | Admitting: Family Medicine

## 2023-08-03 DIAGNOSIS — F4322 Adjustment disorder with anxiety: Secondary | ICD-10-CM

## 2023-08-10 LAB — HM DIABETES EYE EXAM

## 2023-08-20 DIAGNOSIS — H10811 Pingueculitis, right eye: Secondary | ICD-10-CM | POA: Diagnosis not present

## 2023-10-20 DIAGNOSIS — H40013 Open angle with borderline findings, low risk, bilateral: Secondary | ICD-10-CM | POA: Diagnosis not present

## 2023-11-08 ENCOUNTER — Other Ambulatory Visit: Payer: Self-pay | Admitting: Family Medicine

## 2023-11-08 DIAGNOSIS — F4322 Adjustment disorder with anxiety: Secondary | ICD-10-CM
